# Patient Record
Sex: Female | Born: 1980 | Race: White | Hispanic: No | Marital: Married | Smoking: Never smoker
Health system: Southern US, Community
[De-identification: ages and names within clinical notes are randomized; demographics above are authoritative.]

## PROBLEM LIST (undated history)

## (undated) DIAGNOSIS — F419 Anxiety disorder, unspecified: Secondary | ICD-10-CM

---

## 2002-08-19 HISTORY — PX: WISDOM TOOTH EXTRACTION: SHX21

## 2012-10-08 ENCOUNTER — Encounter: Payer: Self-pay | Admitting: Certified Nurse Midwife

## 2012-10-08 ENCOUNTER — Ambulatory Visit: Payer: BC Managed Care – PPO | Admitting: Certified Nurse Midwife

## 2012-10-08 VITALS — BP 120/72 | Ht 64.0 in | Wt 127.0 lb

## 2012-10-08 MED ORDER — LEVONORGESTREL-ETHINYL ESTRAD 0.15-30 MG-MCG PO TABS: 1.0000 | ORAL_TABLET | Freq: Every day | ORAL | Status: DC

## 2012-10-08 MED ORDER — LEVONORGEST-ETH ESTRAD 91-DAY 0.15-0.03 MG PO TABS: 1.0000 | ORAL_TABLET | Freq: Every day | ORAL | Status: DC

## 2012-10-08 NOTE — Patient Instructions (Signed)
Brief discussion on pg. Planning Continue PNV if elects to conceive RTO prn and AEX

## 2012-10-08 NOTE — Progress Notes (Signed)
Annual Physical Exam & refill contraception No complaints voiced Considering a pg next year PE:  Thyroid wnl  Heart: RRR without murmur  Lungs: C to A  No CVAT  Abd: non- tender, no masses, soft  Pelvic:  Ext. Genitalia wnl, no lesions    Speculum exam- nl appearing d/c    Cx. Without lesions, no CMT    Uterus and adnexa with masses and non-tender    Bimanual-WNL Ass:  Nl gyn exam   Plan:  Refill OC's (Amethia)  PNV if considering a pregnancy

## 2012-10-08 NOTE — Progress Notes (Signed)
Last Pap: 10/01/2011 WNL: Yes Regular Periods:yes Contraception: Pill/Amethia  Monthly Breast exam:yes Tetanus<90yrs:yes Nl.Bladder Function:yes Daily BMs:yes Healthy Diet:yes Calcium:yes Mammogram:no Date of Mammogram: N/A Exercise:yes Have often Exercise: walk daily Seatbelt: yes Abuse at home: no Stressful work:yes Sigmoid-colonoscopy: None Bone Density: None PCP: None Change in PMH: None Change in ZOX:WRUE

## 2012-10-12 LAB — PAP IG W/ RFLX HPV ASCU

## 2013-10-11 LAB — OB RESULTS CONSOLE ABO/RH: RH Type: POSITIVE

## 2013-10-11 LAB — OB RESULTS CONSOLE RUBELLA ANTIBODY, IGM: RUBELLA: IMMUNE

## 2013-10-11 LAB — OB RESULTS CONSOLE RPR: RPR: NONREACTIVE

## 2013-10-11 LAB — OB RESULTS CONSOLE HEPATITIS B SURFACE ANTIGEN: HEP B S AG: NEGATIVE

## 2013-10-11 LAB — OB RESULTS CONSOLE ANTIBODY SCREEN: Antibody Screen: NEGATIVE

## 2013-10-11 LAB — OB RESULTS CONSOLE HIV ANTIBODY (ROUTINE TESTING): HIV: NONREACTIVE

## 2013-11-05 ENCOUNTER — Encounter (HOSPITAL_COMMUNITY): Payer: Self-pay

## 2013-11-05 ENCOUNTER — Inpatient Hospital Stay (HOSPITAL_COMMUNITY)
Admission: AD | Admit: 2013-11-05 | Discharge: 2013-11-15 | DRG: 765 | Disposition: A | Discharge status: Home / Self Care | Payer: BC Managed Care – PPO | Source: Ambulatory Visit | Attending: Obstetrics and Gynecology | Admitting: Obstetrics and Gynecology

## 2013-11-05 DIAGNOSIS — F411 Generalized anxiety disorder: Secondary | ICD-10-CM | POA: Diagnosis present

## 2013-11-05 DIAGNOSIS — D689 Coagulation defect, unspecified: Secondary | ICD-10-CM | POA: Diagnosis present

## 2013-11-05 DIAGNOSIS — O9912 Other diseases of the blood and blood-forming organs and certain disorders involving the immune mechanism complicating childbirth: Secondary | ICD-10-CM

## 2013-11-05 DIAGNOSIS — O99344 Other mental disorders complicating childbirth: Secondary | ICD-10-CM | POA: Diagnosis present

## 2013-11-05 DIAGNOSIS — D649 Anemia, unspecified: Secondary | ICD-10-CM | POA: Diagnosis not present

## 2013-11-05 DIAGNOSIS — O1414 Severe pre-eclampsia complicating childbirth: Principal | ICD-10-CM | POA: Diagnosis present

## 2013-11-05 DIAGNOSIS — Z98891 History of uterine scar from previous surgery: Secondary | ICD-10-CM

## 2013-11-05 DIAGNOSIS — O1490 Unspecified pre-eclampsia, unspecified trimester: Secondary | ICD-10-CM | POA: Diagnosis present

## 2013-11-05 DIAGNOSIS — O163 Unspecified maternal hypertension, third trimester: Secondary | ICD-10-CM | POA: Diagnosis present

## 2013-11-05 DIAGNOSIS — O9903 Anemia complicating the puerperium: Secondary | ICD-10-CM | POA: Diagnosis not present

## 2013-11-05 DIAGNOSIS — D696 Thrombocytopenia, unspecified: Secondary | ICD-10-CM | POA: Diagnosis present

## 2013-11-05 HISTORY — DX: Anxiety disorder, unspecified: F41.9

## 2013-11-05 LAB — COMPREHENSIVE METABOLIC PANEL
ALT: 56 U/L — ABNORMAL HIGH (ref 0–35)
AST: 35 U/L (ref 0–37)
Albumin: 2.6 g/dL — ABNORMAL LOW (ref 3.5–5.2)
Alkaline Phosphatase: 121 U/L — ABNORMAL HIGH (ref 39–117)
BUN: 12 mg/dL (ref 6–23)
CO2: 19 meq/L (ref 19–32)
Calcium: 9.4 mg/dL (ref 8.4–10.5)
Chloride: 102 mEq/L (ref 96–112)
Creatinine, Ser: 0.67 mg/dL (ref 0.50–1.10)
GFR calc non Af Amer: >90 mL/min (ref >90)
GLUCOSE: 69 mg/dL — AB (ref 70–99)
POTASSIUM: 4 meq/L (ref 3.7–5.3)
SODIUM: 136 meq/L — AB (ref 137–147)
Total Bilirubin: 0.2 mg/dL — ABNORMAL LOW (ref 0.3–1.2)
Total Protein: 6.3 g/dL (ref 6.0–8.3)

## 2013-11-05 LAB — CBC
HCT: 34.8 % — ABNORMAL LOW (ref 36.0–46.0)
HEMOGLOBIN: 12.1 g/dL (ref 12.0–15.0)
MCH: 31.9 pg (ref 26.0–34.0)
MCHC: 34.8 g/dL (ref 30.0–36.0)
MCV: 91.8 fL (ref 78.0–100.0)
Platelets: 134 10*3/uL — ABNORMAL LOW (ref 150–400)
RBC: 3.79 MIL/uL — AB (ref 3.87–5.11)
RDW: 14.6 % (ref 11.5–15.5)
WBC: 10.9 10*3/uL — ABNORMAL HIGH (ref 4.0–10.5)

## 2013-11-05 LAB — URIC ACID: URIC ACID, SERUM: 4.4 mg/dL (ref 2.4–7.0)

## 2013-11-05 LAB — PROTEIN / CREATININE RATIO, URINE
Creatinine, Urine: 44.42 mg/dL
Protein Creatinine Ratio: 0.58 — ABNORMAL HIGH (ref 0.00–0.15)
Total Protein, Urine: 25.8 mg/dL

## 2013-11-05 LAB — LACTATE DEHYDROGENASE: LDH: 243 U/L (ref 94–250)

## 2013-11-05 MED ORDER — PRENATAL MULTIVITAMIN CH
1.0000 | ORAL_TABLET | Freq: Every day | ORAL | Status: DC
  Administered 2013-11-06 – 2013-11-11 (×6): 1 via ORAL
  Filled 2013-11-05 (×6): qty 1

## 2013-11-05 MED ORDER — DOCUSATE SODIUM 100 MG PO CAPS
100.0000 mg | ORAL_CAPSULE | Freq: Every day | ORAL | Status: DC
  Administered 2013-11-06 – 2013-11-11 (×5): 100 mg via ORAL
  Filled 2013-11-05 (×5): qty 1

## 2013-11-05 MED ORDER — CALCIUM CARBONATE ANTACID 500 MG PO CHEW
2.0000 | CHEWABLE_TABLET | ORAL | Status: DC | PRN
  Administered 2013-11-05 – 2013-11-07 (×4): 400 mg via ORAL
  Filled 2013-11-05 (×8): qty 1

## 2013-11-05 MED ORDER — LABETALOL HCL 200 MG PO TABS
200.0000 mg | ORAL_TABLET | Freq: Three times a day (TID) | ORAL | Status: DC
Start: 2013-11-05 — End: 2013-11-06
  Administered 2013-11-05 – 2013-11-06 (×3): 200 mg via ORAL
  Filled 2013-11-05 (×6): qty 1

## 2013-11-05 MED ORDER — ZOLPIDEM TARTRATE 5 MG PO TABS
5.0000 mg | ORAL_TABLET | Freq: Every evening | ORAL | Status: DC | PRN
  Administered 2013-11-06 – 2013-11-11 (×3): 5 mg via ORAL
  Filled 2013-11-05 (×3): qty 1

## 2013-11-05 MED ORDER — BETAMETHASONE SOD PHOS & ACET 6 (3-3) MG/ML IJ SUSP
12.0000 mg | INTRAMUSCULAR | Status: AC
  Administered 2013-11-05 – 2013-11-06 (×2): 12 mg via INTRAMUSCULAR
  Filled 2013-11-05 (×2): qty 2

## 2013-11-05 MED ORDER — ACETAMINOPHEN 325 MG PO TABS
650.0000 mg | ORAL_TABLET | ORAL | Status: DC | PRN
  Administered 2013-11-05 – 2013-11-11 (×9): 650 mg via ORAL
  Filled 2013-11-05 (×9): qty 2

## 2013-11-05 NOTE — H&P (Signed)
Emily Turner is a 33 y.o. female presenting for Elevated blood pressures in the office. Denies HA, blurred vision, double vision, edema, SOB, or epigastric pain.  Reports active fetus.  No LOF, VB, or ctx.  Maternal Medical History:  Reason for admission: Elevated B/P in office.  R/O PIH.  Fetal activity: Perceived fetal activity is normal.   Last perceived fetal movement was within the past hour.    Prenatal complications: PIH.   Prenatal Complications - Diabetes: none.    OB History   Grav Para Term Preterm Abortions TAB SAB Ect Mult Living   1              Past Medical History  Diagnosis Date  . Anxiety    Past Surgical History  Procedure Laterality Date  . Wisdom tooth extraction  2004   Family History: family history includes Hyperlipidemia in her mother; Hypertension in her mother. Social History:  reports that she has never smoked. She has never used smokeless tobacco. She reports that she does not drink alcohol or use illicit drugs.   Prenatal Transfer Tool  Maternal Diabetes: No Genetic Screening: Declined Maternal Ultrasounds/Referrals: Normal Fetal Ultrasounds or other Referrals:  None Maternal Substance Abuse:  No Significant Maternal Medications:  None Significant Maternal Lab Results:  None Other Comments:  None  Review of Systems  Respiratory: Negative.   Musculoskeletal: Negative.       Blood pressure 131/94, pulse 89, temperature 98.3 F (36.8 C), temperature source Oral, resp. rate 20, height 5\' 4"  (1.626 m), weight 142 lb 6.4 oz (64.592 kg), SpO2 99.00%. Exam Physical Exam  Prenatal labs: ABO, Rh:  B+  Antibody:  Negative Rubella:  Immune RPR:   NonReactive HBsAg:   Negative  HIV:   Negative GBS:   Unknown  Assessment: IUP at 32wks Elevated BP Rule Out Preeclampsia  Plan: Admit to Antenatal for Observation per Dr. Dorris CarnesN. Dillard Labs: CMP, UA, LDH, CBC, and PC Ratio--Pending Labetalol 200mg  TID PO Betamethasone now, repeat in 24  hours  1600 Consult with Dr. Audree CamelN. Dillard  Patient status discussed-anxiety; wants to know info regarding possible c/s and NICU admission for fetus Labs Reviewed Repeat labs at 0500 NICU Consult ordered Await PC Ratio-report to Dr. Audree CamelN. Dillard once available    [facility]oursvilleEMLY, Emily Turner CNM, MSN 11/05/2013, 4:29 PM

## 2013-11-05 NOTE — Progress Notes (Signed)
Chaplain received referral from patient's RN, who said that patient is a new admission with high anxiety. Chaplain met with patient and patient's husband. Patient appeared anxious, but said she is "much better since receiving blood pressure medication." RN took her blood pressure during visit and patient was relieved that it was much lower. She said "she is grateful that she had an appointment today because otherwise she wouldn't have known that she was sick." She "hopes to be able to go home." She was just preparing for a nap, so chaplain explained spiritual care services and availability. Provided compassionate and empathic listening, and emotional and spiritual support. Will follow up as able, but please page if needed.  ° °Hillary D Irusta, Chaplain [phone] °

## 2013-11-05 NOTE — Progress Notes (Signed)
S: Patient calm with spouse at bedside. Reports HA of 5/10 O:  Filed Vitals:   11/05/13 1423 11/05/13 1533 11/05/13 1602 11/05/13 1724  BP: 154/108 156/116 131/94   Pulse: 86 99 89   Temp:   98.3 F (36.8 C)   TempSrc:      Resp:   20 20  Height:      Weight:      SpO2:       Results for orders placed during the hospital encounter of 11/05/13 (from the past 24 hour(s))  PROTEIN / CREATININE RATIO, URINE     Status: Abnormal   Collection Time    11/05/13 12:30 PM      Result Value Ref Range   Creatinine, Urine 44.42     Total Protein, Urine 25.8     PROTEIN CREATININE RATIO 0.58 (*) 0.00 - 0.15  COMPREHENSIVE METABOLIC PANEL     Status: Abnormal   Collection Time    11/05/13  2:10 PM      Result Value Ref Range   Sodium 136 (*) 137 - 147 mEq/L   Potassium 4.0  3.7 - 5.3 mEq/L   Chloride 102  96 - 112 mEq/L   CO2 19  19 - 32 mEq/L   Glucose, Bld 69 (*) 70 - 99 mg/dL   BUN 12  6 - 23 mg/dL   Creatinine, Ser 8.11  0.50 - 1.10 mg/dL   Calcium 9.4  8.4 - 91.4 mg/dL   Total Protein 6.3  6.0 - 8.3 g/dL   Albumin 2.6 (*) 3.5 - 5.2 g/dL   AST 35  0 - 37 U/L   ALT 56 (*) 0 - 35 U/L   Alkaline Phosphatase 121 (*) 39 - 117 U/L   Total Bilirubin 0.2 (*) 0.3 - 1.2 mg/dL   GFR calc non Af Amer >90  >90 mL/min   GFR calc Af Amer >90  >90 mL/min  URIC ACID     Status: None   Collection Time    11/05/13  2:10 PM      Result Value Ref Range   Uric Acid, Serum 4.4  2.4 - 7.0 mg/dL  LACTATE DEHYDROGENASE     Status: None   Collection Time    11/05/13  2:10 PM      Result Value Ref Range   LDH 243  94 - 250 U/L  CBC     Status: Abnormal   Collection Time    11/05/13  2:10 PM      Result Value Ref Range   WBC 10.9 (*) 4.0 - 10.5 K/uL   RBC 3.79 (*) 3.87 - 5.11 MIL/uL   Hemoglobin 12.1  12.0 - 15.0 g/dL   HCT 78.2 (*) 95.6 - 21.3 %   MCV 91.8  78.0 - 100.0 fL   MCH 31.9  26.0 - 34.0 pg   MCHC 34.8  30.0 - 36.0 g/dL   RDW 08.6  57.8 - 46.9 %   Platelets 134 (*) 150 - 400 K/uL    A: IUP at 32wks Preeclampsia  P: PC Ratio results obtained.  Discussed and consulted with Dr. Audree Camel.  To room to discuss PC ratio results and Preeclampsia diagnosis with patient.  Questions and concerns addressed.  Explained that will attempt to await until bethamethasone dosing is complete x 24 hours until delivery.  However, POC will be established based on MFM recommendations.  Patient verbalized understanding.  Requests to stay updated regarding POC and infant status. MFM  consult ordered. Tylenol for HA Continue to monitor Reassess prn.  Satoshi Kalas LYNN 6:33 PM

## 2013-11-05 NOTE — MAU Note (Signed)
Pt here from MD office [facility] Denies h/a, dizziness, blurred vision. No bleeding or vag d/c issues.

## 2013-11-06 ENCOUNTER — Observation Stay (HOSPITAL_COMMUNITY): Payer: BC Managed Care – PPO

## 2013-11-06 LAB — COMPREHENSIVE METABOLIC PANEL
ALBUMIN: 2.3 g/dL — AB (ref 3.5–5.2)
ALT: 45 U/L — ABNORMAL HIGH (ref 0–35)
AST: 24 U/L (ref 0–37)
Alkaline Phosphatase: 112 U/L (ref 39–117)
BUN: 11 mg/dL (ref 6–23)
CALCIUM: 9.1 mg/dL (ref 8.4–10.5)
CHLORIDE: 104 meq/L (ref 96–112)
CO2: 19 mEq/L (ref 19–32)
CREATININE: 0.64 mg/dL (ref 0.50–1.10)
GFR calc Af Amer: >90 mL/min (ref >90)
GFR calc non Af Amer: >90 mL/min (ref >90)
Glucose, Bld: 128 mg/dL — ABNORMAL HIGH (ref 70–99)
Potassium: 4.3 mEq/L (ref 3.7–5.3)
Sodium: 136 mEq/L — ABNORMAL LOW (ref 137–147)
TOTAL PROTEIN: 5.9 g/dL — AB (ref 6.0–8.3)

## 2013-11-06 LAB — CBC
HCT: 30.9 % — ABNORMAL LOW (ref 36.0–46.0)
Hemoglobin: 10.6 g/dL — ABNORMAL LOW (ref 12.0–15.0)
MCH: 31.5 pg (ref 26.0–34.0)
MCHC: 34.3 g/dL (ref 30.0–36.0)
MCV: 91.7 fL (ref 78.0–100.0)
Platelets: 125 10*3/uL — ABNORMAL LOW (ref 150–400)
RBC: 3.37 MIL/uL — ABNORMAL LOW (ref 3.87–5.11)
RDW: 14.6 % (ref 11.5–15.5)
WBC: 15.5 10*3/uL — ABNORMAL HIGH (ref 4.0–10.5)

## 2013-11-06 LAB — URIC ACID: Uric Acid, Serum: 4.1 mg/dL (ref 2.4–7.0)

## 2013-11-06 LAB — LACTATE DEHYDROGENASE: LDH: 203 U/L (ref 94–250)

## 2013-11-06 MED ORDER — LABETALOL HCL 100 MG PO TABS
100.0000 mg | ORAL_TABLET | Freq: Three times a day (TID) | ORAL | Status: DC
  Administered 2013-11-06 – 2013-11-09 (×10): 100 mg via ORAL
  Filled 2013-11-06 (×13): qty 1

## 2013-11-06 NOTE — Progress Notes (Signed)
Late entry.  Called away to delivery.  Pt evaluated at ~0840.   HD #2  IUP @ 32 3/7 weeks Severe Preeclampsia, Asymmetric growth.  Pt without complaints.  Denies headaches, visual changes, abdominal pain.  Fetus is active. Concerns about appearance of baby and its "big head" upon delivery. Less anxious about proceeding with delivery preterm.  Awaiting NICU consult.  No contractions, LOF, VB.     Filed Vitals:   11/05/13 2304 11/06/13 0645 11/06/13 0745 11/06/13 0746  BP: 129/76 125/70  125/79  Pulse: 81 88  81  Temp: 98.3 F (36.8 C) 98.3 F (36.8 C) 98.6 F (37 C)   TempSrc: Oral Oral Oral   Resp: 18 20    Height:      Weight:      SpO2:       Gen:  NAD, upright in bed seated in [place] style CV:  RRR Lungs:  CTA bilaterally Abdomen:  No RUQ pain, gravid uterus, NT Ext:  Trace edema, no calf tenderness Neuro: 2+ DTR.  Cat 1 tracing.  Good variability with accels.  No decelerations.  No contractions.    Results for orders placed during the hospital encounter of 11/05/13 (from the past 24 hour(s))  PROTEIN / CREATININE RATIO, URINE     Status: Abnormal   Collection Time    11/05/13 12:30 PM      Result Value Ref Range   Creatinine, Urine 44.42     Total Protein, Urine 25.8     PROTEIN CREATININE RATIO 0.58 (*) 0.00 - 0.15  COMPREHENSIVE METABOLIC PANEL     Status: Abnormal   Collection Time    11/05/13  2:10 PM      Result Value Ref Range   Sodium 136 (*) 137 - 147 mEq/L   Potassium 4.0  3.7 - 5.3 mEq/L   Chloride 102  96 - 112 mEq/L   CO2 19  19 - 32 mEq/L   Glucose, Bld 69 (*) 70 - 99 mg/dL   BUN 12  6 - 23 mg/dL   Creatinine, Ser 1.61  0.50 - 1.10 mg/dL   Calcium 9.4  8.4 - 09.6 mg/dL   Total Protein 6.3  6.0 - 8.3 g/dL   Albumin 2.6 (*) 3.5 - 5.2 g/dL   AST 35  0 - 37 U/L   ALT 56 (*) 0 - 35 U/L   Alkaline Phosphatase 121 (*) 39 - 117 U/L   Total Bilirubin 0.2 (*) 0.3 - 1.2 mg/dL   GFR calc non Af Amer >90  >90 mL/min   GFR calc Af Amer >90  >90  mL/min  URIC ACID     Status: None   Collection Time    11/05/13  2:10 PM      Result Value Ref Range   Uric Acid, Serum 4.4  2.4 - 7.0 mg/dL  LACTATE DEHYDROGENASE     Status: None   Collection Time    11/05/13  2:10 PM      Result Value Ref Range   LDH 243  94 - 250 U/L  CBC     Status: Abnormal   Collection Time    11/05/13  2:10 PM      Result Value Ref Range   WBC 10.9 (*) 4.0 - 10.5 K/uL   RBC 3.79 (*) 3.87 - 5.11 MIL/uL   Hemoglobin 12.1  12.0 - 15.0 g/dL   HCT 04.5 (*) 40.9 - 81.1 %   MCV 91.8  78.0 - 100.0  fL   MCH 31.9  26.0 - 34.0 pg   MCHC 34.8  30.0 - 36.0 g/dL   RDW 32.314.6  55.711.5 - 32.215.5 %   Platelets 134 (*) 150 - 400 K/uL  CBC     Status: Abnormal   Collection Time    11/06/13  5:40 AM      Result Value Ref Range   WBC 15.5 (*) 4.0 - 10.5 K/uL   RBC 3.37 (*) 3.87 - 5.11 MIL/uL   Hemoglobin 10.6 (*) 12.0 - 15.0 g/dL   HCT 02.530.9 (*) 42.736.0 - 06.246.0 %   MCV 91.7  78.0 - 100.0 fL   MCH 31.5  26.0 - 34.0 pg   MCHC 34.3  30.0 - 36.0 g/dL   RDW 37.614.6  28.311.5 - 15.115.5 %   Platelets 125 (*) 150 - 400 K/uL  COMPREHENSIVE METABOLIC PANEL     Status: Abnormal   Collection Time    11/06/13  5:40 AM      Result Value Ref Range   Sodium 136 (*) 137 - 147 mEq/L   Potassium 4.3  3.7 - 5.3 mEq/L   Chloride 104  96 - 112 mEq/L   CO2 19  19 - 32 mEq/L   Glucose, Bld 128 (*) 70 - 99 mg/dL   BUN 11  6 - 23 mg/dL   Creatinine, Ser 7.610.64  0.50 - 1.10 mg/dL   Calcium 9.1  8.4 - 60.710.5 mg/dL   Total Protein 5.9 (*) 6.0 - 8.3 g/dL   Albumin 2.3 (*) 3.5 - 5.2 g/dL   AST 24  0 - 37 U/L   ALT 45 (*) 0 - 35 U/L   Alkaline Phosphatase 112  39 - 117 U/L   Total Bilirubin <0.2 (*) 0.3 - 1.2 mg/dL   GFR calc non Af Amer >90  >90 mL/min   GFR calc Af Amer >90  >90 mL/min  LACTATE DEHYDROGENASE     Status: None   Collection Time    11/06/13  5:40 AM      Result Value Ref Range   LDH 203  94 - 250 U/L  URIC ACID     Status: None   Collection Time    11/06/13  5:40 AM      Result Value Ref  Range   Uric Acid, Serum 4.1  2.4 - 7.0 mg/dL   A:  IUP at 32 3/7 wks Severe Preeclampsia by BP. Asymmetric growth, Elevated umbilical artery doppler.   -BP normal to mildly elevated with Labetalol 200 mg TID and bedrest.   -Platelets decreased but not less than 100.  ALT trending down. S/p BMZ #1.  Next dose due at 1500.  P:  Per Dr. Joya Gaskinsilliard, plan of care to be determined by MFM.  Awaiting consult at this time. Continue inpatient bedrest and Labetalol at current dose. Weight today. Continuous fetal monitoring. Labs in am to reassess platelets. Awaiting NICU consult. Discussed diagnosis and possible POC with pt and husband at length.  Anticipate IOL 24 hours after last dose of BMZ. All questions answered.  RN present.  Emily Turner 12:13 PM

## 2013-11-06 NOTE — Consult Note (Signed)
MFM Staff Consult Note  Impressions: Remigio EisenmengerSheena Turner is a 33 y.o. G1 at 7232 3/7wks  Persistent severe range blood pressures with proteinuria by spot P:C ratio 0.5  --->  Constitutes Dx:  Preeclampsia with severe features (by BP criteria)  Summary of Recommendations: 1. Agree with initiation of course of corticosteroids,  2. Treat severe range blood pressures with antihypertensive IV (eg, labetalol) as needed 3. Continue labetalol as initiated but given trend in BP she is supratherapeutic so I would decrease from 200mg  po TID to 100mg  po TID 3. CEFM while getting steroids then move toward BID NST for fetal well-being 4. Would recommend starting a 24 hour urine collection for protein 5. Would assess this patient clinically frequently to detect evidence of either maternal or fetal deterioration, noting that expectant management would not be recommended if deterioration of either mom or fetus became apparent 6. Would assess LFTs/Cr/CBC  24 hours at a minimum given patient's presenting BP's to facilitate early detection of maternal end organ failure.  If Cr is at or above 1.2, delivery would be recommended.  If platelet count becomes less than 100,000, delivery would be recommended.  If patient becomes hypoglycemic and/or transaminitis is clearly demonstrated (eg,2-3-fold upper limit of normal values), I would recommend discontinuing expectant management.  If maternal HTN is not amenable to control then I would recommend delivery. 7. If eclampsia is encountered, would recommend treatment of eclamptic seizure to stabilize the maternal-fetal unit.  Then, once stabilization has been established, discontinue expectant management and deliver this patient. 8. Recommend targeted ultrasound by our practice 9. Lastly, regardless of whether this mom and baby continue to remain stable, I would recommend delivery by 34 weeks with Magnesium sulfate during induction/intrapartum/postpartum, noting this remains contingent  upon a reassuring fetal heart tracing and stable maternal condition.  If maternal/fetal deterioration criteria above are demonstrated, start magnesium sulfate and move toward delivery.  I spent in excess of 45 minutes reviewing and evaluating this patient's case with greater than 50% of this time in face to face discussion.  Page with questions.  Merideth AbbeyJ. M. Jaylina Ramdass, MD, MS, FACOG Assistant Professor, Maternal-Fetal Medicine

## 2013-11-07 LAB — COMPREHENSIVE METABOLIC PANEL
ALBUMIN: 2.4 g/dL — AB (ref 3.5–5.2)
ALK PHOS: 110 U/L (ref 39–117)
ALT: 35 U/L (ref 0–35)
AST: 17 U/L (ref 0–37)
BUN: 12 mg/dL (ref 6–23)
CHLORIDE: 104 meq/L (ref 96–112)
CO2: 19 mEq/L (ref 19–32)
Calcium: 9 mg/dL (ref 8.4–10.5)
Creatinine, Ser: 0.7 mg/dL (ref 0.50–1.10)
GFR calc Af Amer: >90 mL/min (ref >90)
GFR calc non Af Amer: >90 mL/min (ref >90)
Glucose, Bld: 123 mg/dL — ABNORMAL HIGH (ref 70–99)
POTASSIUM: 4.2 meq/L (ref 3.7–5.3)
SODIUM: 138 meq/L (ref 137–147)
Total Protein: 5.9 g/dL — ABNORMAL LOW (ref 6.0–8.3)

## 2013-11-07 LAB — CBC
HCT: 31.4 % — ABNORMAL LOW (ref 36.0–46.0)
Hemoglobin: 10.8 g/dL — ABNORMAL LOW (ref 12.0–15.0)
MCH: 32 pg (ref 26.0–34.0)
MCHC: 34.4 g/dL (ref 30.0–36.0)
MCV: 93.2 fL (ref 78.0–100.0)
PLATELETS: 157 10*3/uL (ref 150–400)
RBC: 3.37 MIL/uL — ABNORMAL LOW (ref 3.87–5.11)
RDW: 15 % (ref 11.5–15.5)
WBC: 18.4 10*3/uL — AB (ref 4.0–10.5)

## 2013-11-07 LAB — URINALYSIS, ROUTINE W REFLEX MICROSCOPIC
Bilirubin Urine: NEGATIVE
GLUCOSE, UA: NEGATIVE mg/dL
Hgb urine dipstick: NEGATIVE
KETONES UR: NEGATIVE mg/dL
Leukocytes, UA: NEGATIVE
Nitrite: NEGATIVE
PROTEIN: NEGATIVE mg/dL
Specific Gravity, Urine: 1.01 (ref 1.005–1.030)
Urobilinogen, UA: 0.2 mg/dL (ref 0.0–1.0)
pH: 6 (ref 5.0–8.0)

## 2013-11-07 LAB — PROTEIN, URINE, 24 HOUR
Collection Interval-UPROT: 24 hours
PROTEIN 24H UR: 299 mg/d — AB (ref 50–100)
Protein, Urine: 26 mg/dL
URINE TOTAL VOLUME-UPROT: 1150 mL

## 2013-11-07 NOTE — Progress Notes (Signed)
Late entry.  Called away to delivery.  Pt evaluated at ~0840.   HD #3  IUP @ 32 4/7 weeks Severe Preeclampsia, Asymmetric growth.  Pt without complaints.  Denies headaches, visual changes, abdominal pain.  Fetus is active. C/o malodorous urine, which pt feels is due to Labetalol.  Comfortable with SCD hose.   Awaiting NICU consult.   S/p MFM consult.  Feels comfortable with plan.  Questions answered adequately by Dr. Marjo Bicker.  No contractions, LOF, VB.     Filed Vitals:   11/07/13 0534 11/07/13 0758 11/07/13 0930 11/07/13 1210  BP: 127/84 131/92  136/92  Pulse: 78 77  80  Temp: 98.2 F (36.8 C) 98.2 F (36.8 C)  98.5 F (36.9 C)  TempSrc: Oral Oral  Oral  Resp: 16 18  18   Height:      Weight:   63.957 kg (141 lb)   SpO2:       Gen:  NAD, upright in bed seated in [place] style CV:  RRR Lungs:  CTA bilaterally Abdomen:  No RUQ pain, gravid uterus, NT Ext:  Trace edema, no calf tenderness Neuro: 2+ DTR.  Cat 1 tracing.  Good variability with accels.  Variable decel x 1 to 90s.(1048), spontaneous decel to 100s less than 1 minute (0848)    Results for orders placed during the hospital encounter of 11/05/13 (from the past 24 hour(s))  CBC     Status: Abnormal   Collection Time    11/07/13  5:28 AM      Result Value Ref Range   WBC 18.4 (*) 4.0 - 10.5 K/uL   RBC 3.37 (*) 3.87 - 5.11 MIL/uL   Hemoglobin 10.8 (*) 12.0 - 15.0 g/dL   HCT 96.0 (*) 45.4 - 09.8 %   MCV 93.2  78.0 - 100.0 fL   MCH 32.0  26.0 - 34.0 pg   MCHC 34.4  30.0 - 36.0 g/dL   RDW 11.9  14.7 - 82.9 %   Platelets 157  150 - 400 K/uL  COMPREHENSIVE METABOLIC PANEL     Status: Abnormal   Collection Time    11/07/13  5:28 AM      Result Value Ref Range   Sodium 138  137 - 147 mEq/L   Potassium 4.2  3.7 - 5.3 mEq/L   Chloride 104  96 - 112 mEq/L   CO2 19  19 - 32 mEq/L   Glucose, Bld 123 (*) 70 - 99 mg/dL   BUN 12  6 - 23 mg/dL   Creatinine, Ser 5.62  0.50 - 1.10 mg/dL   Calcium 9.0  8.4 - 13.0 mg/dL    Total Protein 5.9 (*) 6.0 - 8.3 g/dL   Albumin 2.4 (*) 3.5 - 5.2 g/dL   AST 17  0 - 37 U/L   ALT 35  0 - 35 U/L   Alkaline Phosphatase 110  39 - 117 U/L   Total Bilirubin <0.2 (*) 0.3 - 1.2 mg/dL   GFR calc non Af Amer >90  >90 mL/min   GFR calc Af Amer >90  >90 mL/min   A:  IUP at 32 4/7 wks Severe Preeclampsia by BP. Asymmetric growth, Elevated umbilical artery doppler.   -BP normal with Labetalol 100 mg TID and bedrest.  Decreased from 200 mg TID per MFM recommendations.   Consider decreasing to 100 mg BID for a goal of 140s/80-90s.   -Platelets decreased 126 to 123 but not less than 100.  ALT trending down. S/p  BMZ #2.  Mature at 1500 today. Malodorous urine.  P:  Spoke with Dr. Marjo Bickerenney yesterday and reviewed his note.  Recommendations greatly appreciated  F/u results from last nights ultrasound.  24 hour urine protein in progress.  Continue inpatient bedrest and Labetalol at current dose.  NST BID  Daily labs. Check UA and culture 9pm after 24 hour urine collected. Awaiting NICU consult. Delivery if worsening maternal or fetal status.  Emily Turner 12:34 PM

## 2013-11-07 NOTE — Consult Note (Signed)
[facility]  [facility] Consultation       11/07/2013    8:59 PM  I was called at the request of the patient's obstetrician (Dr. Dion BodyVarnado) to speak to this patient due to potential preterm delivery at > 32 weeks.  The patient's prenatal course includes elevated blood pressure.  She is 32 4/7 weeks currently.  She is admitted to antenatal unit, and is receiving treatment that includes labetalol.  The baby is a female.  I reviewed expectations for a baby born at 32-35 weeks, including survival, length of stay, morbidities such as respiratory distress, IVH, infection, feeding intolerance, retinopathy.  I described how we provide respiratory and feeding support.  Mom plans to breast feed, which I encouraged as best for the baby (with supplementations for needed calories).  I let mom know that the baby's outlook generally improves the longer she remains undelivered.  I spent 20 minutes reviewing the record, speaking to the patient, and entering appropriate documentation.  More than 50% of the time was spent face to face with patient.   _____________________ Electronically Signed By: Angelita InglesMcCrae S. Khrystal Jeanmarie, MD Neonatologist

## 2013-11-08 LAB — COMPREHENSIVE METABOLIC PANEL
ALBUMIN: 2.4 g/dL — AB (ref 3.5–5.2)
ALT: 27 U/L (ref 0–35)
AST: 19 U/L (ref 0–37)
Alkaline Phosphatase: 102 U/L (ref 39–117)
BUN: 13 mg/dL (ref 6–23)
CALCIUM: 9 mg/dL (ref 8.4–10.5)
CHLORIDE: 104 meq/L (ref 96–112)
CO2: 21 mEq/L (ref 19–32)
Creatinine, Ser: 0.72 mg/dL (ref 0.50–1.10)
GFR calc Af Amer: >90 mL/min (ref >90)
GFR calc non Af Amer: >90 mL/min (ref >90)
Glucose, Bld: 108 mg/dL — ABNORMAL HIGH (ref 70–99)
Potassium: 4.2 mEq/L (ref 3.7–5.3)
Sodium: 138 mEq/L (ref 137–147)
Total Bilirubin: <0.2 mg/dL — ABNORMAL LOW (ref 0.3–1.2)
Total Protein: 6 g/dL (ref 6.0–8.3)

## 2013-11-08 LAB — CBC
HCT: 32.3 % — ABNORMAL LOW (ref 36.0–46.0)
Hemoglobin: 10.8 g/dL — ABNORMAL LOW (ref 12.0–15.0)
MCH: 31.4 pg (ref 26.0–34.0)
MCHC: 33.4 g/dL (ref 30.0–36.0)
MCV: 93.9 fL (ref 78.0–100.0)
PLATELETS: 175 10*3/uL (ref 150–400)
RBC: 3.44 MIL/uL — AB (ref 3.87–5.11)
RDW: 15.3 % (ref 11.5–15.5)
WBC: 15.1 10*3/uL — ABNORMAL HIGH (ref 4.0–10.5)

## 2013-11-08 LAB — URINE CULTURE
Colony Count: NO GROWTH
Culture: NO GROWTH

## 2013-11-08 NOTE — Progress Notes (Signed)
Hospital day # 3 pregnancy at [redacted]w[redacted]d--Pre-eclampsia.  S:  Sleeping at present.    O: BP 150/81  Pulse 61  Temp(Src) 98 F (36.7 C) (Oral)  Resp 20  Ht 5\' 4"  (1.626 m)  Wt 141 lb (63.957 kg)  BMI 24.19 kg/m2  SpO2 99%  Filed Vitals:   11/07/13 2018 11/07/13 2100 11/07/13 2154 11/08/13 0603  BP: 134/79  139/79 150/81  Pulse: 58  65 61  Temp:    98 F (36.7 C)  TempSrc:    Oral  Resp: 16 18 18 20   Height:      Weight:      SpO2:             Fetal tracings:  Category 1 on late evening NST      Contractions:   Very occasional              Labs:   Results for orders placed during the hospital encounter of 11/05/13 (from the past 24 hour(s))  URINALYSIS, ROUTINE W REFLEX MICROSCOPIC     Status: None   Collection Time    11/07/13 10:00 PM      Result Value Ref Range   Color, Urine YELLOW  YELLOW   APPearance CLEAR  CLEAR   Specific Gravity, Urine 1.010  1.005 - 1.030   pH 6.0  5.0 - 8.0   Glucose, UA NEGATIVE  NEGATIVE mg/dL   Hgb urine dipstick NEGATIVE  NEGATIVE   Bilirubin Urine NEGATIVE  NEGATIVE   Ketones, ur NEGATIVE  NEGATIVE mg/dL   Protein, ur NEGATIVE  NEGATIVE mg/dL   Urobilinogen, UA 0.2  0.0 - 1.0 mg/dL   Nitrite NEGATIVE  NEGATIVE   Leukocytes, UA NEGATIVE  NEGATIVE  CBC     Status: Abnormal   Collection Time    11/08/13  5:30 AM      Result Value Ref Range   WBC 15.1 (*) 4.0 - 10.5 K/uL   RBC 3.44 (*) 3.87 - 5.11 MIL/uL   Hemoglobin 10.8 (*) 12.0 - 15.0 g/dL   HCT 16.1 (*) 09.6 - 04.5 %   MCV 93.9  78.0 - 100.0 fL   MCH 31.4  26.0 - 34.0 pg   MCHC 33.4  30.0 - 36.0 g/dL   RDW 40.9  81.1 - 91.4 %   Platelets 175  150 - 400 K/uL  COMPREHENSIVE METABOLIC PANEL     Status: Abnormal   Collection Time    11/08/13  5:30 AM      Result Value Ref Range   Sodium 138  137 - 147 mEq/L   Potassium 4.2  3.7 - 5.3 mEq/L   Chloride 104  96 - 112 mEq/L   CO2 21  19 - 32 mEq/L   Glucose, Bld 108 (*) 70 - 99 mg/dL   BUN 13  6 - 23 mg/dL   Creatinine, Ser 7.82   0.50 - 1.10 mg/dL   Calcium 9.0  8.4 - 95.6 mg/dL   Total Protein 6.0  6.0 - 8.3 g/dL   Albumin 2.4 (*) 3.5 - 5.2 g/dL   AST 19  0 - 37 U/L   ALT 27  0 - 35 U/L   Alkaline Phosphatase 102  39 - 117 U/L   Total Bilirubin <0.2 (*) 0.3 - 1.2 mg/dL   GFR calc non Af Amer >90  >90 mL/min   GFR calc Af Amer >90  >90 mL/min   24 hour urine protein = 299.  Meds: Labetalol 100 mg po TID                 Completed betamethasone course 11/06/13  A: 6430w5d with pre-eclampsia     Stable at present  P: Continue current plan      Daily CBC, CMP      BID NST at present      MDs will follow       Nigel BridgemanLATHAM, Derk Doubek CNM, [place] 11/08/2013 6:51 AM

## 2013-11-09 LAB — COMPREHENSIVE METABOLIC PANEL
ALT: 32 U/L (ref 0–35)
AST: 27 U/L (ref 0–37)
Albumin: 2.4 g/dL — ABNORMAL LOW (ref 3.5–5.2)
Alkaline Phosphatase: 100 U/L (ref 39–117)
BUN: 14 mg/dL (ref 6–23)
CO2: 21 meq/L (ref 19–32)
CREATININE: 0.63 mg/dL (ref 0.50–1.10)
Calcium: 8.4 mg/dL (ref 8.4–10.5)
Chloride: 102 mEq/L (ref 96–112)
GFR calc Af Amer: >90 mL/min (ref >90)
GLUCOSE: 77 mg/dL (ref 70–99)
Potassium: 4.3 mEq/L (ref 3.7–5.3)
SODIUM: 135 meq/L — AB (ref 137–147)
TOTAL PROTEIN: 6 g/dL (ref 6.0–8.3)
Total Bilirubin: <0.2 mg/dL — ABNORMAL LOW (ref 0.3–1.2)

## 2013-11-09 LAB — CBC
HCT: 33 % — ABNORMAL LOW (ref 36.0–46.0)
HEMOGLOBIN: 11 g/dL — AB (ref 12.0–15.0)
MCH: 31.3 pg (ref 26.0–34.0)
MCHC: 33.3 g/dL (ref 30.0–36.0)
MCV: 94 fL (ref 78.0–100.0)
Platelets: 190 10*3/uL (ref 150–400)
RBC: 3.51 MIL/uL — AB (ref 3.87–5.11)
RDW: 15.3 % (ref 11.5–15.5)
WBC: 14.4 10*3/uL — ABNORMAL HIGH (ref 4.0–10.5)

## 2013-11-09 MED ORDER — LABETALOL HCL 100 MG PO TABS: 100.0000 mg | ORAL_TABLET | Freq: Every day | ORAL | Status: DC

## 2013-11-09 MED ORDER — LABETALOL HCL 200 MG PO TABS
200.0000 mg | ORAL_TABLET | Freq: Two times a day (BID) | ORAL | Status: DC
  Administered 2013-11-09 – 2013-11-10 (×2): 200 mg via ORAL
  Filled 2013-11-09 (×2): qty 2

## 2013-11-09 MED ORDER — LABETALOL HCL 100 MG PO TABS
100.0000 mg | ORAL_TABLET | Freq: Once | ORAL | Status: AC
  Administered 2013-11-09: 100 mg via ORAL

## 2013-11-09 NOTE — Progress Notes (Signed)
33 y.o. year old female,at 235w6d gestation.  SUBJECTIVE:  The patient reports a slight headache yesterday. This is better today. She reports having one right upper quadrant pain today. This is now resolved.  OBJECTIVE:  BP 159/103  Pulse 68  Temp(Src) 98.2 F (36.8 C) (Oral)  Resp 18  Ht 5\' 4"  (1.626 m)  Wt 140 lb 3.2 oz (63.594 kg)  BMI 24.05 kg/m2  SpO2 99%  Fetal Heart Tones:  Category 1  Contractions:          None  Chest: Clear Heart: Regular rate and rhythm Abdomen: Gravid and nontender Reflexes: Normal. No clonus. Edema: Trace Extremities: No evidence of DVT  Results for orders placed during the hospital encounter of 11/05/13 (from the past 24 hour(s))  CBC     Status: Abnormal   Collection Time    11/09/13  7:00 AM      Result Value Ref Range   WBC 14.4 (*) 4.0 - 10.5 K/uL   RBC 3.51 (*) 3.87 - 5.11 MIL/uL   Hemoglobin 11.0 (*) 12.0 - 15.0 g/dL   HCT 16.133.0 (*) 09.636.0 - 04.546.0 %   MCV 94.0  78.0 - 100.0 fL   MCH 31.3  26.0 - 34.0 pg   MCHC 33.3  30.0 - 36.0 g/dL   RDW 40.915.3  81.111.5 - 91.415.5 %   Platelets 190  150 - 400 K/uL  COMPREHENSIVE METABOLIC PANEL     Status: Abnormal   Collection Time    11/09/13  7:00 AM      Result Value Ref Range   Sodium 135 (*) 137 - 147 mEq/L   Potassium 4.3  3.7 - 5.3 mEq/L   Chloride 102  96 - 112 mEq/L   CO2 21  19 - 32 mEq/L   Glucose, Bld 77  70 - 99 mg/dL   BUN 14  6 - 23 mg/dL   Creatinine, Ser 7.820.63  0.50 - 1.10 mg/dL   Calcium 8.4  8.4 - 95.610.5 mg/dL   Total Protein 6.0  6.0 - 8.3 g/dL   Albumin 2.4 (*) 3.5 - 5.2 g/dL   AST 27  0 - 37 U/L   ALT 32  0 - 35 U/L   Alkaline Phosphatase 100  39 - 117 U/L   Total Bilirubin <0.2 (*) 0.3 - 1.2 mg/dL   GFR calc non Af Amer >90  >90 mL/min   GFR calc Af Amer >90  >90 mL/min    ASSESSMENT:  5335w6d Weeks Pregnancy  Preeclampsia- stable  Slight increase in blood pressures today. Labetalol was decreased to 100 mg 3 times each day yesterday.  PLAN:  Continue observation in  the hospital.  Monitor laboratories daily.  Nonstress test twice each day.  Increase labetalol to 200 mg in the morning and at night and 100 mg mid day.  Leonard SchwartzArthur Vernon Jaquel Coomer, M.D.

## 2013-11-09 NOTE — Progress Notes (Signed)
Chaplain followed up with patient and patient's mother. Patient said she will be here for another 8 days and then will be delivering. She and her mother both said they are "fine" and have no current needs at this time. They are familiar with chaplain services and availability.   Maurene CapesHillary D Irusta, Chaplain

## 2013-11-09 NOTE — Progress Notes (Signed)
Hospital day # 4 pregnancy at 8523w6d--Pre-eclampsia  S:  Sleeping soundly.  RN reports no issues overnight.  O: BP 143/88  Pulse 56  Temp(Src) 98.2 F (36.8 C) (Oral)  Resp 18  Ht 5\' 4"  (1.626 m)  Wt 139 lb 8 oz (63.277 kg)  BMI 23.93 kg/m2  SpO2 99%      Fetal tracings:  Category 1 on BID NST      Contractions:   None               Labs:  CBC, CMP pending this am       Meds: Labetalol 100 mg po TID  A: 4923w6d with pre-eclampsia     Betamethasone course completed 11/06/13     Stable  P: Continue current plan of care      Upcoming tests/treatments:  Induction planned for evening of 11/17/13, per            recommendation of MFM for induction at 34 weeks (patient wants to avoid          delivery on 11/17/13, if possible.      Continue NST BID      MDs will follow  Nigel BridgemanLATHAM, Kilo Eshelman CNM, [place] 11/09/2013 7:10 AM

## 2013-11-10 LAB — CBC
HEMATOCRIT: 34.2 % — AB (ref 36.0–46.0)
HEMOGLOBIN: 11.3 g/dL — AB (ref 12.0–15.0)
MCH: 31.1 pg (ref 26.0–34.0)
MCHC: 33 g/dL (ref 30.0–36.0)
MCV: 94.2 fL (ref 78.0–100.0)
Platelets: 159 10*3/uL (ref 150–400)
RBC: 3.63 MIL/uL — AB (ref 3.87–5.11)
RDW: 14.9 % (ref 11.5–15.5)
WBC: 13.7 10*3/uL — AB (ref 4.0–10.5)

## 2013-11-10 LAB — COMPREHENSIVE METABOLIC PANEL
ALBUMIN: 2.4 g/dL — AB (ref 3.5–5.2)
ALT: 75 U/L — ABNORMAL HIGH (ref 0–35)
AST: 63 U/L — AB (ref 0–37)
Alkaline Phosphatase: 106 U/L (ref 39–117)
BUN: 17 mg/dL (ref 6–23)
CALCIUM: 8.1 mg/dL — AB (ref 8.4–10.5)
CHLORIDE: 105 meq/L (ref 96–112)
CO2: 20 mEq/L (ref 19–32)
CREATININE: 0.66 mg/dL (ref 0.50–1.10)
GFR calc Af Amer: >90 mL/min (ref >90)
GLUCOSE: 83 mg/dL (ref 70–99)
Potassium: 4.4 mEq/L (ref 3.7–5.3)
Sodium: 137 mEq/L (ref 137–147)
Total Bilirubin: <0.2 mg/dL — ABNORMAL LOW (ref 0.3–1.2)
Total Protein: 6.1 g/dL (ref 6.0–8.3)

## 2013-11-10 MED ORDER — LABETALOL HCL 200 MG PO TABS
200.0000 mg | ORAL_TABLET | Freq: Three times a day (TID) | ORAL | Status: DC
  Administered 2013-11-10 (×2): 200 mg via ORAL
  Filled 2013-11-10 (×3): qty 1

## 2013-11-10 NOTE — Progress Notes (Signed)
Pt seen and agree with above. Will increase labetalol to 200mg  TID.  Bps are elevated. Pt is asymptomatic

## 2013-11-10 NOTE — Progress Notes (Signed)
Ur chart review completed.  

## 2013-11-10 NOTE — Progress Notes (Signed)
Hospital day # 5 pregnancy at 4322w0d--PreEclampsia.  S:  Doing well, reports good fetal activity.  Questions regarding induction and delivery.      O: BP 159/98  Pulse 72  Temp(Src) 99.7 F (37.6 C) (Oral)  Resp 18  Ht 5\' 4"  (1.626 m)  Wt 138 lb 14.4 oz (63.005 kg)  BMI 23.83 kg/m2  SpO2 99%      Fetal tracings: Reactive NST       Contractions:   None      Uterus non-tender      Extremities: no significant edema and no signs of DVT          Labs:   Results for orders placed during the hospital encounter of 11/05/13 (from the past 24 hour(s))  CBC     Status: Abnormal   Collection Time    11/10/13  7:24 AM      Result Value Ref Range   WBC 13.7 (*) 4.0 - 10.5 K/uL   RBC 3.63 (*) 3.87 - 5.11 MIL/uL   Hemoglobin 11.3 (*) 12.0 - 15.0 g/dL   HCT 16.134.2 (*) 09.636.0 - 04.546.0 %   MCV 94.2  78.0 - 100.0 fL   MCH 31.1  26.0 - 34.0 pg   MCHC 33.0  30.0 - 36.0 g/dL   RDW 40.914.9  81.111.5 - 91.415.5 %   Platelets 159  150 - 400 K/uL  COMPREHENSIVE METABOLIC PANEL     Status: Abnormal   Collection Time    11/10/13  7:24 AM      Result Value Ref Range   Sodium 137  137 - 147 mEq/L   Potassium 4.4  3.7 - 5.3 mEq/L   Chloride 105  96 - 112 mEq/L   CO2 20  19 - 32 mEq/L   Glucose, Bld 83  70 - 99 mg/dL   BUN 17  6 - 23 mg/dL   Creatinine, Ser 7.820.66  0.50 - 1.10 mg/dL   Calcium 8.1 (*) 8.4 - 10.5 mg/dL   Total Protein 6.1  6.0 - 8.3 g/dL   Albumin 2.4 (*) 3.5 - 5.2 g/dL   AST 63 (*) 0 - 37 U/L   ALT 75 (*) 0 - 35 U/L   Alkaline Phosphatase 106  39 - 117 U/L   Total Bilirubin <0.2 (*) 0.3 - 1.2 mg/dL   GFR calc non Af Amer >90  >90 mL/min   GFR calc Af Amer >90  >90 mL/min          Meds: Labetalol 200mg  TID  A: 322w0d with PreEclampsia      Betamethasone Completed 11/06/13     Stable, unchanged  P: Labetalol increased from 200mg  BID to 200mg  TID NST BID Plan to deliver via IOL at 34 weeks Continue current plan of care  MDs will follow Consulted with Dr. Audree CamelN. Dillard who agrees with  plan  Analyah Mcconnon LYNN CNM, [place] 11/10/2013 11:22 AM

## 2013-11-11 LAB — COMPREHENSIVE METABOLIC PANEL
ALK PHOS: 110 U/L (ref 39–117)
ALT: 82 U/L — ABNORMAL HIGH (ref 0–35)
AST: 59 U/L — ABNORMAL HIGH (ref 0–37)
Albumin: 2.3 g/dL — ABNORMAL LOW (ref 3.5–5.2)
BUN: 16 mg/dL (ref 6–23)
CALCIUM: 8.9 mg/dL (ref 8.4–10.5)
CO2: 19 mEq/L (ref 19–32)
Chloride: 103 mEq/L (ref 96–112)
Creatinine, Ser: 0.69 mg/dL (ref 0.50–1.10)
GFR calc non Af Amer: >90 mL/min (ref >90)
Glucose, Bld: 85 mg/dL (ref 70–99)
Potassium: 4.6 mEq/L (ref 3.7–5.3)
Sodium: 136 mEq/L — ABNORMAL LOW (ref 137–147)
Total Bilirubin: 0.2 mg/dL — ABNORMAL LOW (ref 0.3–1.2)
Total Protein: 5.6 g/dL — ABNORMAL LOW (ref 6.0–8.3)

## 2013-11-11 LAB — CBC
HCT: 33.7 % — ABNORMAL LOW (ref 36.0–46.0)
Hemoglobin: 11.4 g/dL — ABNORMAL LOW (ref 12.0–15.0)
MCH: 31.6 pg (ref 26.0–34.0)
MCHC: 33.8 g/dL (ref 30.0–36.0)
MCV: 93.4 fL (ref 78.0–100.0)
Platelets: 157 10*3/uL (ref 150–400)
RBC: 3.61 MIL/uL — ABNORMAL LOW (ref 3.87–5.11)
RDW: 15 % (ref 11.5–15.5)
WBC: 14.3 10*3/uL — ABNORMAL HIGH (ref 4.0–10.5)

## 2013-11-11 LAB — PROTEIN / CREATININE RATIO, URINE
Creatinine, Urine: 23.62 mg/dL
PROTEIN CREATININE RATIO: 1.44 — AB (ref 0.00–0.15)
Total Protein, Urine: 33.9 mg/dL

## 2013-11-11 MED ORDER — LIDOCAINE HCL (PF) 1 % IJ SOLN: 30.0000 mL | INTRAMUSCULAR | Status: DC | PRN

## 2013-11-11 MED ORDER — FLEET ENEMA 7-19 GM/118ML RE ENEM: 1.0000 | ENEMA | RECTAL | Status: DC | PRN

## 2013-11-11 MED ORDER — LABETALOL HCL 200 MG PO TABS
200.0000 mg | ORAL_TABLET | Freq: Three times a day (TID) | ORAL | Status: DC
  Administered 2013-11-11: 200 mg via ORAL
  Filled 2013-11-11: qty 1

## 2013-11-11 MED ORDER — OXYTOCIN 40 UNITS IN LACTATED RINGERS INFUSION - SIMPLE MED: 62.5000 mL/h | INTRAVENOUS | Status: DC

## 2013-11-11 MED ORDER — LABETALOL HCL 200 MG PO TABS
200.0000 mg | ORAL_TABLET | Freq: Four times a day (QID) | ORAL | Status: DC
  Administered 2013-11-11 – 2013-11-15 (×15): 200 mg via ORAL
  Filled 2013-11-11 (×24): qty 1

## 2013-11-11 MED ORDER — LABETALOL HCL 200 MG PO TABS
200.0000 mg | ORAL_TABLET | Freq: Once | ORAL | Status: DC
  Filled 2013-11-11: qty 1

## 2013-11-11 MED ORDER — MISOPROSTOL 25 MCG QUARTER TABLET
25.0000 ug | ORAL_TABLET | ORAL | Status: DC | PRN
  Administered 2013-11-11 – 2013-11-12 (×3): 25 ug via VAGINAL
  Filled 2013-11-11 (×3): qty 0.25

## 2013-11-11 MED ORDER — ONDANSETRON HCL 4 MG/2ML IJ SOLN: 4.0000 mg | Freq: Four times a day (QID) | INTRAMUSCULAR | Status: DC | PRN

## 2013-11-11 MED ORDER — LACTATED RINGERS IV SOLN
INTRAVENOUS | Status: DC
  Administered 2013-11-12 (×3): via INTRAVENOUS

## 2013-11-11 MED ORDER — LABETALOL HCL 200 MG PO TABS
400.0000 mg | ORAL_TABLET | Freq: Once | ORAL | Status: DC
  Filled 2013-11-11: qty 2

## 2013-11-11 MED ORDER — MAGNESIUM SULFATE 40 G IN LACTATED RINGERS - SIMPLE
2.0000 g/h | INTRAVENOUS | Status: DC
  Administered 2013-11-12: 1 g/h via INTRAVENOUS
  Filled 2013-11-11 (×2): qty 500

## 2013-11-11 MED ORDER — HYDRALAZINE HCL 20 MG/ML IJ SOLN
5.0000 mg | Freq: Once | INTRAMUSCULAR | Status: AC
  Administered 2013-11-11: 5 mg via INTRAVENOUS
  Filled 2013-11-11: qty 1

## 2013-11-11 MED ORDER — IBUPROFEN 600 MG PO TABS: 600.0000 mg | ORAL_TABLET | Freq: Four times a day (QID) | ORAL | Status: DC | PRN

## 2013-11-11 MED ORDER — LABETALOL HCL 200 MG PO TABS: 400.0000 mg | ORAL_TABLET | Freq: Three times a day (TID) | ORAL | Status: DC

## 2013-11-11 MED ORDER — LABETALOL HCL 5 MG/ML IV SOLN
10.0000 mg | INTRAVENOUS | Status: DC | PRN
  Administered 2013-11-12: 20 mg via INTRAVENOUS
  Administered 2013-11-12: 10 mg via INTRAVENOUS
  Filled 2013-11-11 (×2): qty 4

## 2013-11-11 MED ORDER — ACETAMINOPHEN 325 MG PO TABS
650.0000 mg | ORAL_TABLET | ORAL | Status: DC | PRN
  Administered 2013-11-11 – 2013-11-12 (×2): 650 mg via ORAL
  Filled 2013-11-11 (×2): qty 2

## 2013-11-11 MED ORDER — MAGNESIUM SULFATE 4000MG/100ML IJ SOLN: 4.0000 g | Freq: Once | INTRAMUSCULAR | Status: DC

## 2013-11-11 MED ORDER — LACTATED RINGERS IV SOLN: 500.0000 mL | INTRAVENOUS | Status: DC | PRN

## 2013-11-11 MED ORDER — DEXTROSE 5 % IV SOLN
5.0000 10*6.[IU] | Freq: Once | INTRAVENOUS | Status: AC
  Administered 2013-11-12: 5 10*6.[IU] via INTRAVENOUS
  Filled 2013-11-11: qty 5

## 2013-11-11 MED ORDER — TERBUTALINE SULFATE 1 MG/ML IJ SOLN: 0.2500 mg | Freq: Once | INTRAMUSCULAR | Status: AC | PRN

## 2013-11-11 MED ORDER — MAGNESIUM SULFATE 40 G IN LACTATED RINGERS - SIMPLE: 2.0000 g/h | INTRAVENOUS | Status: DC

## 2013-11-11 MED ORDER — DEXTROSE 5 % IV SOLN
2.5000 10*6.[IU] | INTRAVENOUS | Status: DC
  Administered 2013-11-12 (×2): 2.5 10*6.[IU] via INTRAVENOUS
  Filled 2013-11-11 (×5): qty 2.5

## 2013-11-11 MED ORDER — OXYTOCIN BOLUS FROM INFUSION: 500.0000 mL | INTRAVENOUS | Status: DC

## 2013-11-11 MED ORDER — FENTANYL CITRATE 0.05 MG/ML IJ SOLN: 100.0000 ug | INTRAMUSCULAR | Status: DC | PRN

## 2013-11-11 MED ORDER — OXYCODONE-ACETAMINOPHEN 5-325 MG PO TABS: 1.0000 | ORAL_TABLET | ORAL | Status: DC | PRN

## 2013-11-11 MED ORDER — MAGNESIUM SULFATE BOLUS VIA INFUSION
4.0000 g | Freq: Once | INTRAVENOUS | Status: AC
  Administered 2013-11-11: 4 g via INTRAVENOUS
  Filled 2013-11-11: qty 500

## 2013-11-11 MED ORDER — CITRIC ACID-SODIUM CITRATE 334-500 MG/5ML PO SOLN
30.0000 mL | ORAL | Status: DC | PRN
  Administered 2013-11-12: 30 mL via ORAL
  Filled 2013-11-11: qty 15

## 2013-11-11 MED ORDER — PANTOPRAZOLE SODIUM 40 MG PO TBEC
40.0000 mg | DELAYED_RELEASE_TABLET | Freq: Every day | ORAL | Status: DC
  Administered 2013-11-11 – 2013-11-15 (×2): 40 mg via ORAL
  Filled 2013-11-11 (×7): qty 1

## 2013-11-11 NOTE — Progress Notes (Signed)
Pt. Transferred to room 171 for induction due to elevated BP and MFM's recommendation

## 2013-11-11 NOTE — Progress Notes (Signed)
BP continue to increase with more difficulty controlling despite increase in Labetalol PCR now 1.44 with Creatinine at 0.69 LFT are 1.5-2 X upper limit of normal Patient with recurrent mild headache 2-3/10 Reviewed status with Dr Otho PerlNitsche: recommends to proceed with delivery  Reviewed with patient and husband who are agreeable to proceed

## 2013-11-11 NOTE — Progress Notes (Signed)
Antenatal Nutrition Assessment:  Currently  33 1/[redacted] weeks gestation, with HTN. Height  64 "  Weight 138 lbs  pre-pregnancy weight 125 lbs .  Pre-pregnancy  BMI 23.7  IBW 120 lbs Total weight gain 13.lbs Weight gain goals 25-35 lbs Estimated needs: 17-1900 kcal/day, 63-73 grams protein/day, 2 liters fluid/day  Regular diet tolerated well, appetite good. Snacks TID Current diet prescription will provide for increased needs.  No abnormal nutrition related labs  Nutrition Dx: Increased nutrient needs r/t pregnancy and fetal growth requirements aeb [redacted] weeks gestation.  No educational needs assessed at this time.  Elisabeth CaraKatherine Eiley Mcginnity M.Odis LusterEd. R.D. LDN Neonatal Nutrition Support Specialist Pager [phone]

## 2013-11-11 NOTE — Progress Notes (Signed)
Hospital day # 6 pregnancy at [redacted]w[redacted]d with severe pre-eclampsia  S: Had elevations of 74BP during the night ad 180/110. Received 1 dose of hydralazine and Labetalol was increased to 200 mg QID: much better since.       Current BP 127/89      well, reports good fetal activity has a mild headache which responded to Tylenol. No visual symptoms. Some GERD      Contractions:none      Vaginal bleeding:none now       Vaginal discharge: no significant change  O: BP 127/89  Pulse 88  Temp(Src) 98.1 F (36.7 C) (Oral)  Resp 18  Ht 5\' 4"  (1.626 m)  Wt 140 lb (63.504 kg)  BMI 24.02 kg/m2  SpO2 99%      Fetal tracings:reviewed and reassuring      Uterus gravid and non-tender      Extremities: no significant edema and no signs of DVT  DTR 1/4  No clonus  Results for [name], Emily (MRN [id number]) as of 11/11/2013 12:06  Ref. Range 11/11/2013 05:58  Sodium Latest Range: 137-147 mEq/L 136 (L)  Potassium Latest Range: 3.7-5.3 mEq/L 4.6  Chloride Latest Range: 96-112 mEq/L 103  CO2 Latest Range: 19-32 mEq/L 19  BUN Latest Range: 6-23 mg/dL 16  Creatinine Latest Range: 0.50-1.10 mg/dL 4.090.69  Calcium Latest Range: 8.4-10.5 mg/dL 8.9  GFR calc non Af Amer Latest Range: >90 mL/min >90  GFR calc Af Amer Latest Range: >90 mL/min >90  Glucose Latest Range: 70-99 mg/dL 85  Alkaline Phosphatase Latest Range: 39-117 U/L 110  Albumin Latest Range: 3.5-5.2 g/dL 2.3 (L)  AST Latest Range: 0-37 U/L 59 (H)  ALT Latest Range: 0-35 U/L 82 (H)  Total Protein Latest Range: 6.0-8.3 g/dL 5.6 (L)  Total Bilirubin Latest Range: 0.3-1.2 mg/dL 0.2 (L)  WBC Latest Range: 4.0-10.5 K/uL 14.3 (H)  RBC Latest Range: 3.87-5.11 MIL/uL 3.61 (L)  Hemoglobin Latest Range: 12.0-15.0 g/dL 81.111.4 (L)  HCT Latest Range: 36.0-46.0 % 33.7 (L)  MCV Latest Range: 78.0-100.0 fL 93.4  MCH Latest Range: 26.0-34.0 pg 31.6  MCHC Latest Range: 30.0-36.0 g/dL 91.433.8  RDW Latest Range: 11.5-15.5 % 15.0  Platelets Latest Range: 150-400 K/uL 157     A: 3764w1d with severe pre-eclampsia     stable  P: continue current plan of care  Alvie Speltz A  MD 11/11/2013 12:08 PM

## 2013-11-11 NOTE — Progress Notes (Signed)
S:  Reports mild HA.  Patient currently eating in hopes of alleviating HA. Good fetal movement.  Denies contractions, LOF, and VB  O: BP 149/106  Pulse 76  Temp(Src) 98.1 F (36.7 C) (Oral)  Resp 18  Ht 5\' 4"  (1.626 m)  Wt 140 lb (63.504 kg)  BMI 24.02 kg/m2  SpO2 99%      Extremities: Skin mottling in BLE, Negative Clonus,  Reflexes; RLE-1+ & LLE 2+ and no significant edema and no signs of DVT          Labs:  PC Ratio at 1.44       Meds: Labetalol 200mg  QID, Last dose at 1550  A: IUP at 4326w1d PreEclampsia Gradually worsening  P:Patient informed of change in status and need for further consult with Dr. Kathie RhodesS. Rivard.   Dr.S. Estanislado Pandyivard consulted-No new orders now--will further consult with MFM for POC Tylenol for HA Continue to monitor  Emily Turner CNM, [place] 11/11/2013 6:06 PM

## 2013-11-11 NOTE — Progress Notes (Addendum)
  Subjective: Resting on right side--husband at bedside.  Reports mild frontal HA, thinks sinus related.  Objective: BP 163/109  Pulse 79  Temp(Src) 97.9 F (36.6 C) (Oral)  Resp 20  Ht 5\' 4"  (1.626 m)  Wt 140 lb (63.504 kg)  BMI 24.02 kg/m2  SpO2 98% I/O last 3 completed shifts: In: 3723.5 [P.O.:3650; I.V.:42.5; Other:31] Out: 2400 [Urine:2400] Total I/O In: 91.7 [I.V.:91.7] Out: 950 [Urine:950]  Filed Vitals:   11/11/13 2100 11/11/13 2201 11/11/13 2202 11/11/13 2313  BP:   143/89 163/109  Pulse:  88 82 79  Temp: 97.9 F (36.6 C)     TempSrc: Oral     Resp: 20  20 20   Height:      Weight:      SpO2:       Just received PO Labetalol 200 mg dose at 2311   FHT: Category 1 UC:   Occasional, mild SVE:   Deferred at present  Assessment:  Induction for pre-eclampsia S/p initial dose of Cytotech at 2030  Plan: Observe BP s/p po Labetalol dose.  If still elevated in 1 hour, will give IV labetalol per BP parameters. Repeat Cytotech q 4 hours (ND due at 0030) Tylenol for HA  Emily Turner 11/11/2013, 11:27 PM

## 2013-11-11 NOTE — Progress Notes (Signed)
  Subjective: Now on Birthing Suite--husband at bedside.    Objective: BP 137/105  Pulse 88  Temp(Src) 98.1 F (36.7 C) (Oral)  Resp 20  Ht 5\' 4"  (1.626 m)  Wt 140 lb (63.504 kg)  BMI 24.02 kg/m2  SpO2 98% I/O last 3 completed shifts: In: 3723.5 [P.O.:3650; I.V.:42.5; Other:31] Out: 2400 [Urine:2400] Total I/O In: -  Out: 150 [Urine:150]  Filed Vitals:   11/11/13 1913 11/11/13 1918 11/11/13 2003 11/11/13 2030  BP:   140/93 137/105  Pulse: 87 81 88 88  Temp:      TempSrc:      Resp:      Height:      Weight:      SpO2: 98% 98%       FHT:  Category 1 UC:  Occasional, mild SVE:   Dilation: 1 Effacement (%): 50 Station: -2 Exam by:: Gracelyn Nursergia Whitfield RN 1st Cytotech placed by RN at 2030  Mag bolus completed, now on 2 gm/hr.  Assessment:  IUP at 33 1/7 weeks Severe pre-eclampsia Unknown GBS  Plan: Reviewed plan of care with patient and husband, including use of Magnesium through 24 hours post-delivery, treatment of elevated BP prn with labetalol IV, use of Cytotech for cervical ripening, pain med use during labor.  Patient and husband agreeable with plan, and patient is willing to use epidural if needed for labor/BP support. Plan Cytotech q 4 hours prn, pitocin when appropriate. Plan GBS prophylaxis for unknown GBS in this preterm fetus--will begin with onset of active labor or with initiation of pitocin.  Emily Turner, Emily Turner 11/11/2013, 9:09 PM

## 2013-11-12 ENCOUNTER — Encounter (HOSPITAL_COMMUNITY): Payer: BC Managed Care – PPO | Admitting: Anesthesiology

## 2013-11-12 ENCOUNTER — Encounter (HOSPITAL_COMMUNITY): Payer: Self-pay | Admitting: Anesthesiology

## 2013-11-12 ENCOUNTER — Encounter (HOSPITAL_COMMUNITY)
Admission: AD | Disposition: A | Discharge status: Home / Self Care | Payer: Self-pay | Source: Ambulatory Visit | Attending: Obstetrics and Gynecology

## 2013-11-12 ENCOUNTER — Inpatient Hospital Stay (HOSPITAL_COMMUNITY): Payer: BC Managed Care – PPO | Admitting: Anesthesiology

## 2013-11-12 DIAGNOSIS — Z98891 History of uterine scar from previous surgery: Secondary | ICD-10-CM

## 2013-11-12 DIAGNOSIS — O1490 Unspecified pre-eclampsia, unspecified trimester: Secondary | ICD-10-CM | POA: Diagnosis present

## 2013-11-12 LAB — COMPREHENSIVE METABOLIC PANEL
ALK PHOS: 114 U/L (ref 39–117)
ALT: 84 U/L — ABNORMAL HIGH (ref 0–35)
ALT: 127 U/L — ABNORMAL HIGH (ref 0–35)
AST: 61 U/L — ABNORMAL HIGH (ref 0–37)
AST: 110 U/L — ABNORMAL HIGH (ref 0–37)
Albumin: 2.4 g/dL — ABNORMAL LOW (ref 3.5–5.2)
Albumin: 2.4 g/dL — ABNORMAL LOW (ref 3.5–5.2)
Alkaline Phosphatase: 118 U/L — ABNORMAL HIGH (ref 39–117)
BILIRUBIN TOTAL: 0.3 mg/dL (ref 0.3–1.2)
BUN: 11 mg/dL (ref 6–23)
BUN: 12 mg/dL (ref 6–23)
CHLORIDE: 100 meq/L (ref 96–112)
CHLORIDE: 99 meq/L (ref 96–112)
CO2: 21 mEq/L (ref 19–32)
CO2: 20 meq/L (ref 19–32)
CREATININE: 0.63 mg/dL (ref 0.50–1.10)
Calcium: 7.1 mg/dL — ABNORMAL LOW (ref 8.4–10.5)
Calcium: 6.5 mg/dL — ABNORMAL LOW (ref 8.4–10.5)
Creatinine, Ser: 0.64 mg/dL (ref 0.50–1.10)
GFR calc Af Amer: >90 mL/min (ref >90)
GLUCOSE: 111 mg/dL — AB (ref 70–99)
Glucose, Bld: 100 mg/dL — ABNORMAL HIGH (ref 70–99)
POTASSIUM: 4.1 meq/L (ref 3.7–5.3)
Potassium: 4.6 mEq/L (ref 3.7–5.3)
Sodium: 134 mEq/L — ABNORMAL LOW (ref 137–147)
Sodium: 132 mEq/L — ABNORMAL LOW (ref 137–147)
Total Bilirubin: 0.3 mg/dL (ref 0.3–1.2)
Total Protein: 6.4 g/dL (ref 6.0–8.3)
Total Protein: 6 g/dL (ref 6.0–8.3)

## 2013-11-12 LAB — CBC
HCT: 35.3 % — ABNORMAL LOW (ref 36.0–46.0)
HCT: 34.4 % — ABNORMAL LOW (ref 36.0–46.0)
HEMOGLOBIN: 12 g/dL (ref 12.0–15.0)
HEMOGLOBIN: 12 g/dL (ref 12.0–15.0)
MCH: 31.3 pg (ref 26.0–34.0)
MCH: 32.1 pg (ref 26.0–34.0)
MCHC: 34 g/dL (ref 30.0–36.0)
MCHC: 34.9 g/dL (ref 30.0–36.0)
MCV: 91.9 fL (ref 78.0–100.0)
MCV: 92 fL (ref 78.0–100.0)
Platelets: 143 10*3/uL — ABNORMAL LOW (ref 150–400)
Platelets: 122 10*3/uL — ABNORMAL LOW (ref 150–400)
RBC: 3.84 MIL/uL — ABNORMAL LOW (ref 3.87–5.11)
RBC: 3.74 MIL/uL — AB (ref 3.87–5.11)
RDW: 14.9 % (ref 11.5–15.5)
RDW: 14.7 % (ref 11.5–15.5)
WBC: 13.2 10*3/uL — AB (ref 4.0–10.5)
WBC: 15.2 10*3/uL — AB (ref 4.0–10.5)

## 2013-11-12 LAB — URIC ACID
URIC ACID, SERUM: 3.8 mg/dL (ref 2.4–7.0)
URIC ACID, SERUM: 4 mg/dL (ref 2.4–7.0)

## 2013-11-12 LAB — MAGNESIUM: MAGNESIUM: 7.2 mg/dL — AB (ref 1.5–2.5)

## 2013-11-12 LAB — LACTATE DEHYDROGENASE: LDH: 284 U/L — AB (ref 94–250)

## 2013-11-12 SURGERY — Surgical Case
Anesthesia: Spinal | Site: Abdomen

## 2013-11-12 MED ORDER — SENNOSIDES-DOCUSATE SODIUM 8.6-50 MG PO TABS
2.0000 | ORAL_TABLET | ORAL | Status: DC
  Administered 2013-11-13 – 2013-11-14 (×3): 2 via ORAL
  Filled 2013-11-12 (×3): qty 2

## 2013-11-12 MED ORDER — FENTANYL CITRATE 0.05 MG/ML IJ SOLN
INTRAMUSCULAR | Status: DC | PRN
  Administered 2013-11-12: 25 ug via INTRATHECAL

## 2013-11-12 MED ORDER — MENTHOL 3 MG MT LOZG
1.0000 | LOZENGE | OROMUCOSAL | Status: DC | PRN
Start: 2013-11-12 — End: 2013-11-15

## 2013-11-12 MED ORDER — MEDROXYPROGESTERONE ACETATE 150 MG/ML IM SUSP: 150.0000 mg | INTRAMUSCULAR | Status: DC | PRN

## 2013-11-12 MED ORDER — SIMETHICONE 80 MG PO CHEW
80.0000 mg | CHEWABLE_TABLET | Freq: Three times a day (TID) | ORAL | Status: DC
  Administered 2013-11-14 – 2013-11-15 (×3): 80 mg via ORAL
  Filled 2013-11-12 (×4): qty 1

## 2013-11-12 MED ORDER — DIPHENHYDRAMINE HCL 50 MG/ML IJ SOLN: 12.5000 mg | INTRAMUSCULAR | Status: DC | PRN

## 2013-11-12 MED ORDER — BUPIVACAINE-EPINEPHRINE 0.5% -1:200000 IJ SOLN
INTRAMUSCULAR | Status: DC | PRN
  Administered 2013-11-12: 10 mL

## 2013-11-12 MED ORDER — TERBUTALINE SULFATE 1 MG/ML IJ SOLN: 0.2500 mg | Freq: Once | INTRAMUSCULAR | Status: DC | PRN

## 2013-11-12 MED ORDER — PHENYLEPHRINE HCL 10 MG/ML IJ SOLN
INTRAMUSCULAR | Status: DC | PRN
  Administered 2013-11-12 (×3): 40 ug via INTRAVENOUS

## 2013-11-12 MED ORDER — NALOXONE HCL 0.4 MG/ML IJ SOLN: 0.4000 mg | INTRAMUSCULAR | Status: DC | PRN

## 2013-11-12 MED ORDER — OXYTOCIN 10 UNIT/ML IJ SOLN
INTRAMUSCULAR | Status: AC
  Filled 2013-11-12: qty 4

## 2013-11-12 MED ORDER — ONDANSETRON HCL 4 MG/2ML IJ SOLN: 4.0000 mg | INTRAMUSCULAR | Status: DC | PRN

## 2013-11-12 MED ORDER — DIPHENHYDRAMINE HCL 25 MG PO CAPS
25.0000 mg | ORAL_CAPSULE | ORAL | Status: DC | PRN
  Filled 2013-11-12: qty 1

## 2013-11-12 MED ORDER — FENTANYL CITRATE 0.05 MG/ML IJ SOLN
INTRAMUSCULAR | Status: DC | PRN
  Administered 2013-11-12 (×3): 25 ug via INTRAVENOUS

## 2013-11-12 MED ORDER — DIPHENHYDRAMINE HCL 50 MG/ML IJ SOLN: 25.0000 mg | INTRAMUSCULAR | Status: DC | PRN

## 2013-11-12 MED ORDER — NALBUPHINE HCL 10 MG/ML IJ SOLN: 5.0000 mg | INTRAMUSCULAR | Status: DC | PRN

## 2013-11-12 MED ORDER — ONDANSETRON HCL 4 MG/2ML IJ SOLN
INTRAMUSCULAR | Status: DC | PRN
  Administered 2013-11-12: 4 mg via INTRAVENOUS

## 2013-11-12 MED ORDER — METOCLOPRAMIDE HCL 5 MG/ML IJ SOLN: 10.0000 mg | Freq: Three times a day (TID) | INTRAMUSCULAR | Status: DC | PRN

## 2013-11-12 MED ORDER — MORPHINE SULFATE (PF) 0.5 MG/ML IJ SOLN
INTRAMUSCULAR | Status: DC | PRN
  Administered 2013-11-12: .15 mg via INTRATHECAL

## 2013-11-12 MED ORDER — MEASLES, MUMPS & RUBELLA VAC ~~LOC~~ INJ: 0.5000 mL | INJECTION | Freq: Once | SUBCUTANEOUS | Status: DC

## 2013-11-12 MED ORDER — WITCH HAZEL-GLYCERIN EX PADS: 1.0000 "application " | MEDICATED_PAD | CUTANEOUS | Status: DC | PRN

## 2013-11-12 MED ORDER — DIBUCAINE 1 % RE OINT
1.0000 | TOPICAL_OINTMENT | RECTAL | Status: DC | PRN
Start: 2013-11-12 — End: 2013-11-15

## 2013-11-12 MED ORDER — SODIUM CHLORIDE 0.9 % IJ SOLN: 3.0000 mL | INTRAMUSCULAR | Status: DC | PRN

## 2013-11-12 MED ORDER — SIMETHICONE 80 MG PO CHEW
80.0000 mg | CHEWABLE_TABLET | ORAL | Status: DC
  Administered 2013-11-14: 80 mg via ORAL
  Filled 2013-11-12: qty 1

## 2013-11-12 MED ORDER — OXYCODONE-ACETAMINOPHEN 5-325 MG PO TABS
1.0000 | ORAL_TABLET | ORAL | Status: DC | PRN
  Administered 2013-11-13: 2 via ORAL
  Administered 2013-11-13: 1 via ORAL
  Administered 2013-11-14 – 2013-11-15 (×7): 2 via ORAL
  Filled 2013-11-12 (×8): qty 2
  Filled 2013-11-12: qty 1

## 2013-11-12 MED ORDER — DEXTROSE 5 % IV SOLN: 1.0000 ug/kg/h | INTRAVENOUS | Status: DC | PRN

## 2013-11-12 MED ORDER — LACTATED RINGERS IV SOLN
INTRAVENOUS | Status: DC
  Administered 2013-11-13 (×3): via INTRAVENOUS

## 2013-11-12 MED ORDER — SIMETHICONE 80 MG PO CHEW: 80.0000 mg | CHEWABLE_TABLET | ORAL | Status: DC | PRN

## 2013-11-12 MED ORDER — SCOPOLAMINE 1 MG/3DAYS TD PT72
1.0000 | MEDICATED_PATCH | Freq: Once | TRANSDERMAL | Status: AC
  Administered 2013-11-12: 1.5 mg via TRANSDERMAL

## 2013-11-12 MED ORDER — ZOLPIDEM TARTRATE 5 MG PO TABS
5.0000 mg | ORAL_TABLET | Freq: Every evening | ORAL | Status: DC | PRN
  Administered 2013-11-13: 5 mg via ORAL
  Filled 2013-11-12: qty 1

## 2013-11-12 MED ORDER — NALBUPHINE HCL 10 MG/ML IJ SOLN
INTRAMUSCULAR | Status: AC
  Administered 2013-11-12: 10 mg via SUBCUTANEOUS
  Filled 2013-11-12: qty 1

## 2013-11-12 MED ORDER — OXYTOCIN 10 UNIT/ML IJ SOLN
40.0000 [IU] | INTRAVENOUS | Status: DC | PRN
  Administered 2013-11-12: 40 [IU] via INTRAVENOUS

## 2013-11-12 MED ORDER — LABETALOL HCL 5 MG/ML IV SOLN: 10.0000 mg | INTRAVENOUS | Status: DC | PRN

## 2013-11-12 MED ORDER — PRENATAL MULTIVITAMIN CH
1.0000 | ORAL_TABLET | Freq: Every day | ORAL | Status: DC
  Administered 2013-11-13 – 2013-11-15 (×3): 1 via ORAL
  Filled 2013-11-12 (×3): qty 1

## 2013-11-12 MED ORDER — TETANUS-DIPHTH-ACELL PERTUSSIS 5-2.5-18.5 LF-MCG/0.5 IM SUSP
0.5000 mL | Freq: Once | INTRAMUSCULAR | Status: DC
  Filled 2013-11-12: qty 0.5

## 2013-11-12 MED ORDER — ONDANSETRON HCL 4 MG/2ML IJ SOLN: 4.0000 mg | Freq: Three times a day (TID) | INTRAMUSCULAR | Status: DC | PRN

## 2013-11-12 MED ORDER — OXYTOCIN 40 UNITS IN LACTATED RINGERS INFUSION - SIMPLE MED
1.0000 m[IU]/min | INTRAVENOUS | Status: DC
  Administered 2013-11-12: 1 m[IU]/min via INTRAVENOUS
  Filled 2013-11-12: qty 1000

## 2013-11-12 MED ORDER — ONDANSETRON HCL 4 MG PO TABS: 4.0000 mg | ORAL_TABLET | ORAL | Status: DC | PRN

## 2013-11-12 MED ORDER — FENTANYL CITRATE 0.05 MG/ML IJ SOLN
INTRAMUSCULAR | Status: AC
  Filled 2013-11-12: qty 2

## 2013-11-12 MED ORDER — LANOLIN HYDROUS EX OINT
1.0000 | TOPICAL_OINTMENT | CUTANEOUS | Status: DC | PRN
Start: 2013-11-12 — End: 2013-11-15

## 2013-11-12 MED ORDER — ONDANSETRON HCL 4 MG/2ML IJ SOLN
INTRAMUSCULAR | Status: AC
  Filled 2013-11-12: qty 2

## 2013-11-12 MED ORDER — FENTANYL CITRATE 0.05 MG/ML IJ SOLN: 25.0000 ug | INTRAMUSCULAR | Status: DC | PRN

## 2013-11-12 MED ORDER — MEPERIDINE HCL 25 MG/ML IJ SOLN: 6.2500 mg | INTRAMUSCULAR | Status: DC | PRN

## 2013-11-12 MED ORDER — LACTATED RINGERS IV BOLUS (SEPSIS)
500.0000 mL | Freq: Once | INTRAVENOUS | Status: AC
  Administered 2013-11-12: 1000 mL via INTRAVENOUS

## 2013-11-12 MED ORDER — ACETAMINOPHEN 325 MG PO TABS
650.0000 mg | ORAL_TABLET | ORAL | Status: DC | PRN
  Administered 2013-11-12 – 2013-11-13 (×3): 650 mg via ORAL
  Filled 2013-11-12 (×2): qty 2

## 2013-11-12 MED ORDER — BUPIVACAINE-EPINEPHRINE (PF) 0.5% -1:200000 IJ SOLN
INTRAMUSCULAR | Status: AC
  Filled 2013-11-12: qty 10

## 2013-11-12 MED ORDER — SCOPOLAMINE 1 MG/3DAYS TD PT72
MEDICATED_PATCH | TRANSDERMAL | Status: AC
  Administered 2013-11-12: 1.5 mg via TRANSDERMAL
  Filled 2013-11-12: qty 1

## 2013-11-12 MED ORDER — OXYTOCIN 40 UNITS IN LACTATED RINGERS INFUSION - SIMPLE MED: 62.5000 mL/h | INTRAVENOUS | Status: AC

## 2013-11-12 MED ORDER — MORPHINE SULFATE 0.5 MG/ML IJ SOLN
INTRAMUSCULAR | Status: AC
  Filled 2013-11-12: qty 10

## 2013-11-12 MED ORDER — 0.9 % SODIUM CHLORIDE (POUR BTL) OPTIME
TOPICAL | Status: DC | PRN
  Administered 2013-11-12: 1000 mL

## 2013-11-12 MED ORDER — NALBUPHINE HCL 10 MG/ML IJ SOLN
5.0000 mg | INTRAMUSCULAR | Status: DC | PRN
  Administered 2013-11-12: 10 mg via SUBCUTANEOUS

## 2013-11-12 MED ORDER — DIPHENHYDRAMINE HCL 25 MG PO CAPS: 25.0000 mg | ORAL_CAPSULE | Freq: Four times a day (QID) | ORAL | Status: DC | PRN

## 2013-11-12 MED ORDER — CEFAZOLIN SODIUM-DEXTROSE 2-3 GM-% IV SOLR
2.0000 g | INTRAVENOUS | Status: AC
  Administered 2013-11-12: 2 g via INTRAVENOUS

## 2013-11-12 MED ORDER — CEFAZOLIN SODIUM-DEXTROSE 2-3 GM-% IV SOLR
INTRAVENOUS | Status: AC
  Filled 2013-11-12: qty 50

## 2013-11-12 MED ORDER — BUPIVACAINE IN DEXTROSE 0.75-8.25 % IT SOLN
INTRATHECAL | Status: DC | PRN
  Administered 2013-11-12: 1.5 mL via INTRATHECAL

## 2013-11-12 MED ORDER — MAGNESIUM SULFATE 40 G IN LACTATED RINGERS - SIMPLE
1.0000 g/h | INTRAVENOUS | Status: DC
  Filled 2013-11-12: qty 500

## 2013-11-12 MED ORDER — PHENYLEPHRINE 40 MCG/ML (10ML) SYRINGE FOR IV PUSH (FOR BLOOD PRESSURE SUPPORT)
PREFILLED_SYRINGE | INTRAVENOUS | Status: AC
Start: 2013-11-12 — End: 2013-11-12
  Filled 2013-11-12: qty 5

## 2013-11-12 SURGICAL SUPPLY — 39 items
CLAMP CORD UMBIL (MISCELLANEOUS) IMPLANT
CLOTH BEACON ORANGE TIMEOUT ST (SAFETY) ×3 IMPLANT
CONTAINER PREFILL 10% NBF 15ML (MISCELLANEOUS) IMPLANT
DRAIN JACKSON PRT FLT 7MM (DRAIN) IMPLANT
DRAPE LG THREE QUARTER DISP (DRAPES) ×3 IMPLANT
DRSG OPSITE POSTOP 4X10 (GAUZE/BANDAGES/DRESSINGS) ×3 IMPLANT
DURAPREP 26ML APPLICATOR (WOUND CARE) ×3 IMPLANT
ELECT REM PT RETURN 9FT ADLT (ELECTROSURGICAL) ×3
ELECTRODE REM PT RTRN 9FT ADLT (ELECTROSURGICAL) ×1 IMPLANT
EVACUATOR SILICONE 100CC (DRAIN) IMPLANT
EXTRACTOR VACUUM M CUP 4 TUBE (SUCTIONS) IMPLANT
EXTRACTOR VACUUM M CUP 4' TUBE (SUCTIONS)
GLOVE BIOGEL PI IND STRL 8.5 (GLOVE) ×1 IMPLANT
GLOVE BIOGEL PI INDICATOR 8.5 (GLOVE) ×2
GLOVE ECLIPSE 8.0 STRL XLNG CF (GLOVE) ×6 IMPLANT
GOWN STRL REUS W/TWL LRG LVL3 (GOWN DISPOSABLE) ×9 IMPLANT
KIT ABG SYR 3ML LUER SLIP (SYRINGE) ×3 IMPLANT
NEEDLE HYPO 25X1 1.5 SAFETY (NEEDLE) ×3 IMPLANT
NEEDLE HYPO 25X5/8 SAFETYGLIDE (NEEDLE) ×3 IMPLANT
PACK C SECTION WH (CUSTOM PROCEDURE TRAY) ×3 IMPLANT
PAD ABD 7.5X8 STRL (GAUZE/BANDAGES/DRESSINGS) ×3 IMPLANT
PAD OB MATERNITY 4.3X12.25 (PERSONAL CARE ITEMS) ×3 IMPLANT
RINGERS IRRIG 1000ML POUR BTL (IV SOLUTION) IMPLANT
SPONGE GAUZE 4X4 12PLY (GAUZE/BANDAGES/DRESSINGS) ×3 IMPLANT
STAPLER VISISTAT 35W (STAPLE) IMPLANT
SUT MNCRL AB 3-0 PS2 27 (SUTURE) IMPLANT
SUT PLAIN 0 NONE (SUTURE) IMPLANT
SUT SILK 3 0 FS 1X18 (SUTURE) IMPLANT
SUT VIC AB 0 CT1 27 (SUTURE) ×4
SUT VIC AB 0 CT1 27XBRD ANBCTR (SUTURE) ×2 IMPLANT
SUT VIC AB 2-0 CTX 36 (SUTURE) ×6 IMPLANT
SUT VIC AB 3-0 CT1 27 (SUTURE)
SUT VIC AB 3-0 CT1 TAPERPNT 27 (SUTURE) IMPLANT
SUT VIC AB 3-0 SH 27 (SUTURE)
SUT VIC AB 3-0 SH 27X BRD (SUTURE) IMPLANT
SYR CONTROL 10ML LL (SYRINGE) ×3 IMPLANT
TOWEL OR 17X24 6PK STRL BLUE (TOWEL DISPOSABLE) ×3 IMPLANT
TRAY FOLEY CATH 14FR (SET/KITS/TRAYS/PACK) ×3 IMPLANT
WATER STERILE IRR 1000ML POUR (IV SOLUTION) IMPLANT

## 2013-11-12 NOTE — Progress Notes (Signed)
FHT have decreased variability and occasional decels. Cx unfavorable. CS recommended. The pt agrees. R and B outlined.  A.V. Stefano GaulStringer, M.D.

## 2013-11-12 NOTE — Progress Notes (Signed)
FHT now Cat 1. Continue observation.  AVStringer, MD

## 2013-11-12 NOTE — Progress Notes (Signed)
Lab called for stat lab draw.

## 2013-11-12 NOTE — Op Note (Signed)
OPERATIVE NOTE  Patient's Name: Emily EisenmengerSheena Levett  Date of Birth: 04-18-1981  Medical Records Number: [id number]  Date of Operation: 11/12/2013  Preoperative diagnosis:  [redacted]w[redacted]d weeks gestation  Severe preeclampsia  Nonreassuring fetal heart rate tracing  Postoperative diagnosis:  605w2d weeks gestation  Severe preeclampsia  Nonreassuring fetal heart rate tracing  Procedure:  Primary low transverse cesarean section  Surgeon:  Leonard SchwartzArthur Vernon Keondrick Dilks, M.D.  Assistant:  Haroldine LawsJennifer Oxley, certified nurse midwife  Anesthesia:  Spinal  Disposition:  Emily EisenmengerSheena Papania is a 33 y.o. female, G1P0, who presents at 345w2d weeks gestation. [facility]. She has the above mentioned diagnosis. Her liver enzymes have double and her platelet count has started to fall. Induction was recommended. The patient had recurrent late decelerations with Pitocin. Her Pitocin was discontinued. Her variability for her baby has decreased. It did not improve with oxygen, positioning, nor fluid bolus. Her cervix is 1 cm dilated, and long. She understands the indications for her procedure and she accepts the risk of, but not limited to, anesthetic complications, bleeding, infections, and possible damage to the surrounding organs.  Findings:  A 1760 gram female Casimiro Needle(Michael) was delivered from a occiput transverse position.  The Apgar scores were 8/9 . The uterus, fallopian tubes, and ovaries were normal for the gravid state.  The arterial cord blood pH equals 7.34.  Procedure:  The patient was taken to the operating room where a spinal anesthetic was given. The patient's abdomen was prepped with Chloraprep. The perineum was prepped with betadine. A Foley catheter was placed in the bladder. The patient was sterilely draped. A "timeout" was performed which properly identified the patient and the correct operative  procedure. The lower abdomen was injected with half percent Marcaine with epinephrine. A low transverse incision was made in the abdomen and carried sharply through the subcutaneous tissue, the fascia, and the anterior peritoneum. An incision was made in the lower uterine segment. The incision was extended in a low transverse fashion. The membranes were ruptured. The fetal head was delivered without difficulty. The mouth and nose were suctioned. The remainder of the infant was then delivered. The cord was clamped and cut. The infant was handed to the awaiting pediatric team. The placenta was removed. The uterine cavity was cleaned of amniotic fluid, clotted blood, and membranes. The uterine incision was closed using a running locking suture of 2-0 Vicryl. An imbricating suture of 2-0 Vicryl was placed. The pelvis was vigorously irrigated. Hemostasis was adequate. The anterior peritoneum and the abdominal musculature were closed using 2-0 Vicryl. The fascia was closed using a running suture of 0 Vicryl followed by 3 interrupted sutures of 0 Vicryl. The subcutaneous layer was closed using interrupted sutures of 2-0 Vicryl. The skin was reapproximated using a subcuticular suture of 3-0 Monocryl. Sponge, needle, and instrument counts were correct on 2 occasions. The estimated blood loss for the procedure was 600 cc. The patient tolerated her procedure well. She was transported to the recovery room in stable condition. The infant was taken to the NICU in stable condition. The placenta was sent to pathology.  Leonard SchwartzArthur Vernon Shane Badeaux, M.D.

## 2013-11-12 NOTE — Anesthesia Postprocedure Evaluation (Signed)
  Anesthesia Post-op Note  Patient: Emily EisenmengerSheena Turner  Procedure(s) Performed: Procedure(s): CESAREAN SECTION (N/A)  Patient Location: PACU  Anesthesia Type:Spinal  Level of Consciousness: awake, alert  and oriented  Airway and Oxygen Therapy: Patient Spontanous Breathing  Post-op Pain: none  Post-op Assessment: Post-op Vital signs reviewed, Patient's Cardiovascular Status Stable, Respiratory Function Stable, Patent Airway, No signs of Nausea or vomiting, Pain level controlled, No headache and No backache  Post-op Vital Signs: Reviewed and stable  Complications: No apparent anesthesia complications

## 2013-11-12 NOTE — Progress Notes (Signed)
BP 147/100  Pulse 80  Temp(Src) 98.5 F (36.9 C) (Oral)  Resp 16  Ht 5\' 4"  (1.626 m)  Wt 140 lb (63.504 kg)  BMI 24.02 kg/m2  SpO2 98%  Fetal tracing has moderate variability. Decels noted earlier have resolved.  O2 has been applied. Fluids given. The patient has been positioned.  Will observe for now. Cesarean section may be needed if her baby does not tolerate labor.  Dr. Stefano GaulStringer

## 2013-11-12 NOTE — Anesthesia Preprocedure Evaluation (Signed)
Anesthesia Evaluation  Patient identified by MRN, date of birth, ID band Patient awake    Reviewed: Allergy & Precautions, H&P , NPO status , Patient's Chart, lab work & pertinent test results  Airway Mallampati: III TM Distance: >3 FB Neck ROM: Full    Dental no notable dental hx. (+) Teeth Intact   Pulmonary neg pulmonary ROS,  breath sounds clear to auscultation  Pulmonary exam normal       Cardiovascular hypertension, Pt. on medications and Pt. on home beta blockers Rhythm:Regular Rate:Normal  Pre eclampsia   Neuro/Psych Anxiety negative neurological ROS     GI/Hepatic negative GI ROS, Neg liver ROS,   Endo/Other  negative endocrine ROS  Renal/GU negative Renal ROS  negative genitourinary   Musculoskeletal negative musculoskeletal ROS (+)   Abdominal   Peds  Hematology negative hematology ROS (+)   Anesthesia Other Findings   Reproductive/Obstetrics (+) Pregnancy Pre eclampsia                           Anesthesia Physical Anesthesia Plan  ASA: III and emergent  Anesthesia Plan: Spinal   Post-op Pain Management:    Induction:   Airway Management Planned: Natural Airway  Additional Equipment:   Intra-op Plan:   Post-operative Plan:   Informed Consent: I have reviewed the patients History and Physical, chart, labs and discussed the procedure including the risks, benefits and alternatives for the proposed anesthesia with the patient or authorized representative who has indicated his/her understanding and acceptance.     Plan Discussed with: Anesthesiologist, CRNA and Surgeon  Anesthesia Plan Comments:         Anesthesia Quick Evaluation

## 2013-11-12 NOTE — Progress Notes (Signed)
  Subjective: Pt seems fatigued.  Pt had a lot of questions about POC and status of baby - discussed POC and gave reassurances.  Objective: BP 155/107  Pulse 89  Temp(Src) 98.5 F (36.9 C) (Oral)  Resp 18  Ht 5\' 4"  (1.626 m)  Wt 140 lb (63.504 kg)  BMI 24.02 kg/m2  SpO2 98% I/O last 3 completed shifts: In: 4633.9 [P.O.:2600; I.V.:1752.9; Other:31; IV Piggyback:250] Out: 4625 [Urine:4625] Total I/O In: 615 [P.O.:240; I.V.:375] Out: 250 [Urine:250]  FHT:  Cat I UC:   None  SVE:   Dilation: 1 Effacement (%): 50 Station: -2 Exam by:: J Tywone Bembenek CNM  Assessment:   Induction of labor due to preeclampsia,  3 doses of Cytotec through the night, Pitocin started about 1045 after c/w Dr. Stefano GaulStringer  Labor: Feeling some mensrual-like cramps, will consider foley bulb tonight if needed per Dr. Stefano GaulStringer  Preeclampsia: on magnesium sulfate, no signs or symptoms of toxicity and intake and ouput balanced  Fetal Wellbeing: Category I  Pain Control: Labor support without medications, pt states she does not want epidural but may consider IV pain medication if she needs it. I/D: GBS unknown; PCN prophylaxis started this am; Intact; Afebrile  Anticipated MOD: NSVD   Emily Turner 11/12/2013, 10:44 AM

## 2013-11-12 NOTE — Progress Notes (Signed)
AICU RN in chart to update information - pt will be admitted to AICU following delivery - preeclampsia.

## 2013-11-12 NOTE — Progress Notes (Signed)
Pt extremely anxious and is "upset I didn't get any sleep last night". Manfred ArchV Latham and J Oxley on unit.  Updated on pt status.  Pt off monitors, may shower and eat regular breakfast.  Pt was very happy with this POC.

## 2013-11-12 NOTE — Progress Notes (Signed)
  Subjective: Sleeping soundly.  Awakened for next dose Cytotech.  Received Ambien 2312.  Objective: BP 160/97  Pulse 79  Temp(Src) 98.2 F (36.8 C) (Oral)  Resp 20  Ht 5\' 4"  (1.626 m)  Wt 140 lb (63.504 kg)  BMI 24.02 kg/m2  SpO2 98% I/O last 3 completed shifts: In: 3723.5 [P.O.:3650; I.V.:42.5; Other:31] Out: 2400 [Urine:2400] Total I/O In: 241.7 [I.V.:241.7] Out: 1475 [Urine:1475]  Filed Vitals:   11/11/13 2201 11/11/13 2202 11/11/13 2313 11/12/13 0108  BP:  143/89 163/109 160/97  Pulse: 88 82 79 79  Temp:    98.2 F (36.8 C)  TempSrc:    Oral  Resp:  20 20 20   Height:      Weight:      SpO2:         FHT:  Segments of Category 1--currently minimal variability. UC:   irregular, every 4-7 minutes, mild SVE:   Dilation: 1 Effacement (%): 70 Station: -2 Exam by:: Nigel BridgemanVicki Miu Chiong, CNM 2nd dose Cytotech placed  Assessment:  Induction for pre-eclampsia  Plan: Continue to observe. Hourly BPs Labetalol IV prn for BP parameters On magnesium 2 gm/hr.  Emily Turner 11/12/2013, 1:34 AM

## 2013-11-12 NOTE — Progress Notes (Signed)
  Subjective: RN called to notified of FHT.  Objective: BP 147/100  Pulse 80  Temp(Src) 98.5 F (36.9 C) (Oral)  Resp 16  Ht 5\' 4"  (1.626 m)  Wt 140 lb (63.504 kg)  BMI 24.02 kg/m2  SpO2 98% I/O last 3 completed shifts: In: 4633.9 [P.O.:2600; I.V.:1752.9; Other:31; IV Piggyback:250] Out: 4625 [Urine:4625] Total I/O In: 1093.1 [P.O.:240; I.V.:753.1; IV Piggyback:100] Out: 950 [Urine:950]  FHT:  Cat II UC:   regular, every 2-5 minutes   Assessment:   C/w Dr. Stefano GaulStringer  Induction of labor due to preeclampsia, Pitocin up to 4 miliU then turned off by RN d/t late decels  Labor: Pitocin off - per consult ordered O2, 500 cc bolus, and re-evaluate after 30 mins  Preeclampsia: on magnesium sulfate and no signs or symptoms of toxicity  Fetal Wellbeing: Category II  Pain Control: Labor support without medications  I/D: GBS unknown; PCN prophylaxis started this am; Intact; Afebrile    Anticipated MOD: NSVD vs C/S   Emily Turner 11/12/2013, 1:29 PM

## 2013-11-12 NOTE — Transfer of Care (Signed)
Immediate Anesthesia Transfer of Care Note  Patient: Emily EisenmengerSheena Antosh  Procedure(s) Performed: Procedure(s): CESAREAN SECTION (N/A)  Patient Location: PACU  Anesthesia Type:Spinal  Level of Consciousness: awake  Airway & Oxygen Therapy: Patient Spontanous Breathing  Post-op Assessment: Report given to PACU RN  Post vital signs: Reviewed and stable  Complications: No apparent anesthesia complications

## 2013-11-12 NOTE — Progress Notes (Signed)
Dr Stefano GaulStringer at bedside.  POC for C/S initiated.

## 2013-11-12 NOTE — Progress Notes (Signed)
Pt extremely anxious.  Comfort measures given.  Pt states "has a hard time breathing".  VSS.  Lungs CTA.   J Oxley paged to come evaluate.

## 2013-11-12 NOTE — Progress Notes (Signed)
Annamary RummageJ Oxley RN will discuss POC with Dr Stefano GaulStringer and let this RN know pitocin protocol vs cytotec.  Pt in agreement and verbalizes understanding.

## 2013-11-12 NOTE — Anesthesia Procedure Notes (Signed)
Spinal  Patient location during procedure: OR Start time: 11/12/2013 4:18 PM Staffing Anesthesiologist: Beverely Suen A. Performed by: anesthesiologist  Preanesthetic Checklist Completed: patient identified, site marked, surgical consent, pre-op evaluation, timeout performed, IV checked, risks and benefits discussed and monitors and equipment checked Spinal Block Patient position: sitting Prep: site prepped and draped and DuraPrep Patient monitoring: heart rate, cardiac monitor, continuous pulse ox and blood pressure Approach: midline Location: L3-4 Injection technique: single-shot Needle Needle type: Sprotte  Needle gauge: 24 G Needle length: 9 cm Needle insertion depth: 5 cm Assessment Sensory level: T4 Additional Notes Patient tolerated procedure well. Adequate sensory level.

## 2013-11-13 ENCOUNTER — Encounter (HOSPITAL_COMMUNITY): Payer: Self-pay | Admitting: *Deleted

## 2013-11-13 LAB — CBC
HCT: 29.4 % — ABNORMAL LOW (ref 36.0–46.0)
HEMATOCRIT: 32.3 % — AB (ref 36.0–46.0)
Hemoglobin: 9.9 g/dL — ABNORMAL LOW (ref 12.0–15.0)
Hemoglobin: 10.8 g/dL — ABNORMAL LOW (ref 12.0–15.0)
MCH: 31.1 pg (ref 26.0–34.0)
MCH: 31.3 pg (ref 26.0–34.0)
MCHC: 33.7 g/dL (ref 30.0–36.0)
MCHC: 33.4 g/dL (ref 30.0–36.0)
MCV: 92.5 fL (ref 78.0–100.0)
MCV: 93.6 fL (ref 78.0–100.0)
PLATELETS: 103 10*3/uL — AB (ref 150–400)
Platelets: 90 10*3/uL — ABNORMAL LOW (ref 150–400)
RBC: 3.18 MIL/uL — ABNORMAL LOW (ref 3.87–5.11)
RBC: 3.45 MIL/uL — ABNORMAL LOW (ref 3.87–5.11)
RDW: 14.8 % (ref 11.5–15.5)
RDW: 15.3 % (ref 11.5–15.5)
WBC: 13.3 10*3/uL — ABNORMAL HIGH (ref 4.0–10.5)
WBC: 15.4 10*3/uL — AB (ref 4.0–10.5)

## 2013-11-13 LAB — COMPREHENSIVE METABOLIC PANEL
ALBUMIN: 1.9 g/dL — AB (ref 3.5–5.2)
ALBUMIN: 2.3 g/dL — AB (ref 3.5–5.2)
ALK PHOS: 94 U/L (ref 39–117)
ALK PHOS: 117 U/L (ref 39–117)
ALT: 123 U/L — ABNORMAL HIGH (ref 0–35)
ALT: 121 U/L — AB (ref 0–35)
AST: 114 U/L — ABNORMAL HIGH (ref 0–37)
AST: 95 U/L — ABNORMAL HIGH (ref 0–37)
BILIRUBIN TOTAL: 0.4 mg/dL (ref 0.3–1.2)
BUN: 13 mg/dL (ref 6–23)
BUN: 13 mg/dL (ref 6–23)
CALCIUM: 7.3 mg/dL — AB (ref 8.4–10.5)
CO2: 23 meq/L (ref 19–32)
CO2: 24 mEq/L (ref 19–32)
CREATININE: 0.74 mg/dL (ref 0.50–1.10)
Calcium: 6.3 mg/dL — CL (ref 8.4–10.5)
Chloride: 100 mEq/L (ref 96–112)
Chloride: 99 mEq/L (ref 96–112)
Creatinine, Ser: 0.73 mg/dL (ref 0.50–1.10)
GFR calc Af Amer: >90 mL/min (ref >90)
GFR calc non Af Amer: >90 mL/min (ref >90)
GLUCOSE: 123 mg/dL — AB (ref 70–99)
Glucose, Bld: 90 mg/dL (ref 70–99)
POTASSIUM: 4.4 meq/L (ref 3.7–5.3)
Potassium: 4.5 mEq/L (ref 3.7–5.3)
SODIUM: 136 meq/L — AB (ref 137–147)
Sodium: 134 mEq/L — ABNORMAL LOW (ref 137–147)
TOTAL PROTEIN: 6.1 g/dL (ref 6.0–8.3)
Total Bilirubin: 0.3 mg/dL (ref 0.3–1.2)
Total Protein: 5 g/dL — ABNORMAL LOW (ref 6.0–8.3)

## 2013-11-13 LAB — CALCIUM, IONIZED: CALCIUM ION: 1 mmol/L — AB (ref 1.12–1.23)

## 2013-11-13 LAB — MAGNESIUM: MAGNESIUM: 6 mg/dL — AB (ref 1.5–2.5)

## 2013-11-13 NOTE — Anesthesia Postprocedure Evaluation (Signed)
  Anesthesia Post-op Note  Patient: Emily EisenmengerSheena Turner  Procedure(s) Performed: Procedure(s): CESAREAN SECTION (N/A)  Patient Location: A-ICU  Anesthesia Type:Spinal  Level of Consciousness: awake, alert  and oriented  Airway and Oxygen Therapy: Patient Spontanous Breathing  Post-op Pain: none  Post-op Assessment: Post-op Vital signs reviewed, Patient's Cardiovascular Status Stable, Respiratory Function Stable, No headache, No backache, No residual numbness and No residual motor weakness  Post-op Vital Signs: Reviewed and stable  Complications: No apparent anesthesia complications

## 2013-11-13 NOTE — Lactation Note (Signed)
This note was copied from the chart of Emily Remigio EisenmengerSheena Knudtson. Lactation Consultation Note  Patient Name: Emily Remigio EisenmengerSheena Mignano RUEAV'WToday's Date: 11/13/2013   [facility].  RN got mom pumping yesterday; mom reports getting 15 ml with first pumping but is now getting drops.  Reviewed using Preemie setting on DEBP with 3-4 teardrops and using hands-on pumping with hand expression at end of pumping session.  Taught hand expression with return demonstration and observation of colostrum easily flowing.  Mom is using #24 flanges; LC watched pumping session while doing teaching and #24 flanges fit.  Mom has private insurance; encouraged mom to contact insurance company about getting a DEBP.  Rental packet left with mom and explained how to complete information for pump rental if needed upon discharge; mom will discuss with FOB when he returns to hospital what their plans are regarding pump at discharge. Reviewed NICU booklet and informed of community/hospital support services, groups, and outpatient lactation services.  Encouraged to call for questions.    Lendon KaVann, Wojciech Willetts Walker 11/13/2013, 5:19 PM

## 2013-11-13 NOTE — Progress Notes (Signed)
Subjective:  Postpartum Day 1: Cesarean Delivery  No headaches, blurred vision, or RUQ tenderness. Good pain control..    Objective: Vital signs in last 24 hours: Temp:  [97.1 F (36.2 C)-98.6 F (37 C)] 97.9 F (36.6 C) (03/28 0300) Pulse Rate:  [54-89] 56 (03/28 0700) Resp:  [12-22] 16 (03/28 0609) BP: (104-164)/(71-114) 109/78 mmHg (03/28 0700) SpO2:  [95 %-99 %] 97 % (03/28 0700) Weight:  [135 lb 8 oz (61.462 kg)] 135 lb 8 oz (61.462 kg) (03/27 1903)   Intake/Output Summary (Last 24 hours) at 11/13/13 0814 Last data filed at 11/13/13 0653  Gross per 24 hour  Intake 3524.34 ml  Output   4050 ml  Net -525.66 ml    Intake/Output     03/27 0701 - 03/28 0700 03/28 0701 - 03/29 0700   P.O. 640    I.V. (mL/kg) 2809.3 (45.7)    Other     IV Piggyback 200    Total Intake(mL/kg) 3649.3 (59.4)    Urine (mL/kg/hr) 3450 (2.3)    Blood 600 (0.4)    Total Output 4050     Net -400.7            Physical Exam:  General: alert and no distress Lochia: appropriate Uterine Fundus: firm Incision: dressing clean and dry DVT Evaluation: No evidence of DVT seen on physical exam. Chest: Clear Heart: RRR Reflexes: Normal   Recent Labs  11/12/13 1500 11/13/13 0525  HGB 12.0 9.9*  HCT 34.4* 29.4*   Results for orders placed during the hospital encounter of 11/05/13 (from the past 24 hour(s))  CBC     Status: Abnormal   Collection Time    11/12/13  3:00 PM      Result Value Ref Range   WBC 15.2 (*) 4.0 - 10.5 K/uL   RBC 3.74 (*) 3.87 - 5.11 MIL/uL   Hemoglobin 12.0  12.0 - 15.0 g/dL   HCT 81.134.4 (*) 91.436.0 - 78.246.0 %   MCV 92.0  78.0 - 100.0 fL   MCH 32.1  26.0 - 34.0 pg   MCHC 34.9  30.0 - 36.0 g/dL   RDW 95.614.7  21.311.5 - 08.615.5 %   Platelets 122 (*) 150 - 400 K/uL  COMPREHENSIVE METABOLIC PANEL     Status: Abnormal   Collection Time    11/12/13  3:00 PM      Result Value Ref Range   Sodium 132 (*) 137 - 147 mEq/L   Potassium 4.6  3.7 - 5.3 mEq/L   Chloride 99  96 - 112 mEq/L    CO2 20  19 - 32 mEq/L   Glucose, Bld 100 (*) 70 - 99 mg/dL   BUN 12  6 - 23 mg/dL   Creatinine, Ser 5.780.64  0.50 - 1.10 mg/dL   Calcium 6.5 (*) 8.4 - 10.5 mg/dL   Total Protein 6.0  6.0 - 8.3 g/dL   Albumin 2.4 (*) 3.5 - 5.2 g/dL   AST 469110 (*) 0 - 37 U/L   ALT 127 (*) 0 - 35 U/L   Alkaline Phosphatase 118 (*) 39 - 117 U/L   Total Bilirubin 0.3  0.3 - 1.2 mg/dL   GFR calc non Af Amer >90  >90 mL/min   GFR calc Af Amer >90  >90 mL/min  URIC ACID     Status: None   Collection Time    11/12/13  3:00 PM      Result Value Ref Range   Uric Acid, Serum 4.0  2.4 - 7.0 mg/dL  MAGNESIUM     Status: Abnormal   Collection Time    11/12/13  3:00 PM      Result Value Ref Range   Magnesium 7.2 (*) 1.5 - 2.5 mg/dL  CBC     Status: Abnormal   Collection Time    11/13/13  5:25 AM      Result Value Ref Range   WBC 13.3 (*) 4.0 - 10.5 K/uL   RBC 3.18 (*) 3.87 - 5.11 MIL/uL   Hemoglobin 9.9 (*) 12.0 - 15.0 g/dL   HCT 81.1 (*) 91.4 - 78.2 %   MCV 92.5  78.0 - 100.0 fL   MCH 31.1  26.0 - 34.0 pg   MCHC 33.7  30.0 - 36.0 g/dL   RDW 95.6  21.3 - 08.6 %   Platelets 90 (*) 150 - 400 K/uL  COMPREHENSIVE METABOLIC PANEL     Status: Abnormal   Collection Time    11/13/13  5:25 AM      Result Value Ref Range   Sodium 134 (*) 137 - 147 mEq/L   Potassium 4.5  3.7 - 5.3 mEq/L   Chloride 100  96 - 112 mEq/L   CO2 23  19 - 32 mEq/L   Glucose, Bld 90  70 - 99 mg/dL   BUN 13  6 - 23 mg/dL   Creatinine, Ser 5.78  0.50 - 1.10 mg/dL   Calcium 6.3 (*) 8.4 - 10.5 mg/dL   Total Protein 5.0 (*) 6.0 - 8.3 g/dL   Albumin 1.9 (*) 3.5 - 5.2 g/dL   AST 469 (*) 0 - 37 U/L   ALT 123 (*) 0 - 35 U/L   Alkaline Phosphatase 94  39 - 117 U/L   Total Bilirubin 0.4  0.3 - 1.2 mg/dL   GFR calc non Af Amer >90  >90 mL/min   GFR calc Af Amer >90  >90 mL/min  MAGNESIUM     Status: Abnormal   Collection Time    11/13/13  5:25 AM      Result Value Ref Range   Magnesium 6.0 (*) 1.5 - 2.5 mg/dL     Assessment/Plan: Status post Cesarean section. Doing well postoperatively. Severe PreEclampsia. Increased liver enzymes. Low calcium secondary to low albumin. Discussed with Dr. Vassie Loll. Anemia. Continue current care. Repeat labs this afternoon.  Grant Henkes V 11/13/2013, 8:14 AM

## 2013-11-13 NOTE — Progress Notes (Signed)
Results for Emily Turner, Emily Turner (MRN [id number]) as of 11/13/2013 16:30  Ref. Range 11/13/2013 15:40  WBC Latest Range: 4.0-10.5 K/uL 15.4 (H)  RBC Latest Range: 3.87-5.11 MIL/uL 3.45 (L)  Hemoglobin Latest Range: 12.0-15.0 g/dL 40.910.8 (L)  HCT Latest Range: 36.0-46.0 % 32.3 (L)  MCV Latest Range: 78.0-100.0 fL 93.6  MCH Latest Range: 26.0-34.0 pg 31.3  MCHC Latest Range: 30.0-36.0 g/dL 81.133.4  RDW Latest Range: 11.5-15.5 % 15.3  Platelets Latest Range: 150-400 K/uL 103 (L)  Dr. Stefano GaulStringer made aware. No new orders received.

## 2013-11-13 NOTE — Addendum Note (Signed)
Addendum created 11/13/13 1319 by Shanon PayorSuzanne M Naoma Boxell, CRNA   Modules edited: Notes Section   Notes Section:  File: [id number]

## 2013-11-13 NOTE — Progress Notes (Signed)
Results for [name], Emily (MRN [id number]) as of 11/13/2013 06:27  Ref. Range 11/13/2013 05:25  Sodium Latest Range: 137-147 mEq/L 134 (L)  Potassium Latest Range: 3.7-5.3 mEq/L 4.5  Chloride Latest Range: 96-112 mEq/L 100  CO2 Latest Range: 19-32 mEq/L 23  BUN Latest Range: 6-23 mg/dL 13  Creatinine Latest Range: 0.50-1.10 mg/dL 0.270.74  Calcium Latest Range: 8.4-10.5 mg/dL 6.3 (LL)  GFR calc non Af Amer Latest Range: >90 mL/min >90  GFR calc Af Amer Latest Range: >90 mL/min >90  Glucose Latest Range: 70-99 mg/dL 90  Magnesium Latest Range: 1.5-2.5 mg/dL 6.0 (H)  Alkaline Phosphatase Latest Range: 39-117 U/L 94  Albumin Latest Range: 3.5-5.2 g/dL 1.9 (L)  AST Latest Range: 0-37 U/L 114 (H)  ALT Latest Range: 0-35 U/L 123 (H)  Total Protein Latest Range: 6.0-8.3 g/dL 5.0 (L)  Total Bilirubin Latest Range: 0.3-1.2 mg/dL 0.4   Critical value Calcium 6.3 called to Dr. Stefano GaulStringer

## 2013-11-13 NOTE — Progress Notes (Signed)
This note also relates to the following rows which could not be included: SpO2 - Cannot attach notes to unvalidated device data    11/13/13 1627  Vitals  BP ! 158/108 mmHg  MAP (mmHg) 121  Pulse Rate 76  Ok to go see baby in NICU via W/C

## 2013-11-14 LAB — CBC
HEMATOCRIT: 28.7 % — AB (ref 36.0–46.0)
Hemoglobin: 9.6 g/dL — ABNORMAL LOW (ref 12.0–15.0)
MCH: 31.5 pg (ref 26.0–34.0)
MCHC: 33.4 g/dL (ref 30.0–36.0)
MCV: 94.1 fL (ref 78.0–100.0)
Platelets: 97 10*3/uL — ABNORMAL LOW (ref 150–400)
RBC: 3.05 MIL/uL — ABNORMAL LOW (ref 3.87–5.11)
RDW: 15.3 % (ref 11.5–15.5)
WBC: 14.4 10*3/uL — ABNORMAL HIGH (ref 4.0–10.5)

## 2013-11-14 LAB — COMPREHENSIVE METABOLIC PANEL
ALK PHOS: 94 U/L (ref 39–117)
ALT: 84 U/L — ABNORMAL HIGH (ref 0–35)
AST: 57 U/L — ABNORMAL HIGH (ref 0–37)
Albumin: 2 g/dL — ABNORMAL LOW (ref 3.5–5.2)
BUN: 12 mg/dL (ref 6–23)
CHLORIDE: 103 meq/L (ref 96–112)
CO2: 25 mEq/L (ref 19–32)
CREATININE: 0.66 mg/dL (ref 0.50–1.10)
Calcium: 6.8 mg/dL — ABNORMAL LOW (ref 8.4–10.5)
GFR calc non Af Amer: >90 mL/min (ref >90)
Glucose, Bld: 83 mg/dL (ref 70–99)
Potassium: 4.1 mEq/L (ref 3.7–5.3)
Sodium: 136 mEq/L — ABNORMAL LOW (ref 137–147)
TOTAL PROTEIN: 5.6 g/dL — AB (ref 6.0–8.3)
Total Bilirubin: <0.2 mg/dL — ABNORMAL LOW (ref 0.3–1.2)

## 2013-11-14 NOTE — Progress Notes (Signed)
Subjective: Postpartum Day 2: Cesarean Delivery Patient reports incisional pain and tolerating PO.    Objective: Vital signs in last 24 hours: Temp:  [97.8 F (36.6 C)-98.7 F (37.1 C)] 98.3 F (36.8 C) (03/29 0513) Pulse Rate:  [25-84] 71 (03/29 0600) Resp:  [15-20] 18 (03/29 0513) BP: (117-163)/(69-109) 123/87 mmHg (03/29 0600) SpO2:  [96 %-100 %] 96 % (03/29 0600)  Physical Exam:  General: no distress Lochia: appropriate Uterine Fundus: firm Incision: dressing with a small stain. DVT Evaluation: No evidence of DVT seen on physical exam. Reflexes: Normal  Results for orders placed during the hospital encounter of 11/05/13 (from the past 24 hour(s))  CBC     Status: Abnormal   Collection Time    11/13/13  3:40 PM      Result Value Ref Range   WBC 15.4 (*) 4.0 - 10.5 K/uL   RBC 3.45 (*) 3.87 - 5.11 MIL/uL   Hemoglobin 10.8 (*) 12.0 - 15.0 g/dL   HCT 78.2 (*) 95.6 - 21.3 %   MCV 93.6  78.0 - 100.0 fL   MCH 31.3  26.0 - 34.0 pg   MCHC 33.4  30.0 - 36.0 g/dL   RDW 08.6  57.8 - 46.9 %   Platelets 103 (*) 150 - 400 K/uL  CALCIUM, IONIZED     Status: Abnormal   Collection Time    11/13/13  5:40 PM      Result Value Ref Range   Calcium, Ion 1.00 (*) 1.12 - 1.23 mmol/L  COMPREHENSIVE METABOLIC PANEL     Status: Abnormal   Collection Time    11/13/13  5:40 PM      Result Value Ref Range   Sodium 136 (*) 137 - 147 mEq/L   Potassium 4.4  3.7 - 5.3 mEq/L   Chloride 99  96 - 112 mEq/L   CO2 24  19 - 32 mEq/L   Glucose, Bld 123 (*) 70 - 99 mg/dL   BUN 13  6 - 23 mg/dL   Creatinine, Ser 6.29  0.50 - 1.10 mg/dL   Calcium 7.3 (*) 8.4 - 10.5 mg/dL   Total Protein 6.1  6.0 - 8.3 g/dL   Albumin 2.3 (*) 3.5 - 5.2 g/dL   AST 95 (*) 0 - 37 U/L   ALT 121 (*) 0 - 35 U/L   Alkaline Phosphatase 117  39 - 117 U/L   Total Bilirubin 0.3  0.3 - 1.2 mg/dL   GFR calc non Af Amer >90  >90 mL/min   GFR calc Af Amer >90  >90 mL/min  CBC     Status: Abnormal   Collection Time    11/14/13   5:10 AM      Result Value Ref Range   WBC 14.4 (*) 4.0 - 10.5 K/uL   RBC 3.05 (*) 3.87 - 5.11 MIL/uL   Hemoglobin 9.6 (*) 12.0 - 15.0 g/dL   HCT 52.8 (*) 41.3 - 24.4 %   MCV 94.1  78.0 - 100.0 fL   MCH 31.5  26.0 - 34.0 pg   MCHC 33.4  30.0 - 36.0 g/dL   RDW 01.0  27.2 - 53.6 %   Platelets 97 (*) 150 - 400 K/uL  COMPREHENSIVE METABOLIC PANEL     Status: Abnormal   Collection Time    11/14/13  5:10 AM      Result Value Ref Range   Sodium 136 (*) 137 - 147 mEq/L   Potassium 4.1  3.7 - 5.3 mEq/L  Chloride 103  96 - 112 mEq/L   CO2 25  19 - 32 mEq/L   Glucose, Bld 83  70 - 99 mg/dL   BUN 12  6 - 23 mg/dL   Creatinine, Ser 4.090.66  0.50 - 1.10 mg/dL   Calcium 6.8 (*) 8.4 - 10.5 mg/dL   Total Protein 5.6 (*) 6.0 - 8.3 g/dL   Albumin 2.0 (*) 3.5 - 5.2 g/dL   AST 57 (*) 0 - 37 U/L   ALT 84 (*) 0 - 35 U/L   Alkaline Phosphatase 94  39 - 117 U/L   Total Bilirubin <0.2 (*) 0.3 - 1.2 mg/dL   GFR calc non Af Amer >90  >90 mL/min   GFR calc Af Amer >90  >90 mL/min   I/O last 3 completed shifts: In: 6063.8 [P.O.:1918; I.V.:4145.8] Out: 7050 [Urine:7050]    Recent Labs  11/13/13 1540 11/14/13 0510  HGB 10.8* 9.6*  HCT 32.3* 28.7*    Assessment/Plan: Status post Cesarean section. Doing well postoperatively.  Resolving preeclampsia/HELLP Syndrome. Anemia.  DC magnesium. Transfer to floor. Discharge tomorrow.  Janine LimboSTRINGER,Linkin Vizzini V 11/14/2013, 7:23 AM

## 2013-11-15 ENCOUNTER — Ambulatory Visit: Payer: Self-pay

## 2013-11-15 ENCOUNTER — Encounter (HOSPITAL_COMMUNITY)
Admit: 2013-11-15 | Discharge: 2013-11-15 | Disposition: A | Discharge status: Home / Self Care | Payer: BC Managed Care – PPO | Attending: Obstetrics and Gynecology | Admitting: Obstetrics and Gynecology

## 2013-11-15 ENCOUNTER — Encounter (HOSPITAL_COMMUNITY): Payer: Self-pay | Admitting: Obstetrics and Gynecology

## 2013-11-15 LAB — COMPREHENSIVE METABOLIC PANEL
ALBUMIN: 2.3 g/dL — AB (ref 3.5–5.2)
ALT: 65 U/L — AB (ref 0–35)
AST: 37 U/L (ref 0–37)
Alkaline Phosphatase: 105 U/L (ref 39–117)
BUN: 16 mg/dL (ref 6–23)
CALCIUM: 8.4 mg/dL (ref 8.4–10.5)
CO2: 24 mEq/L (ref 19–32)
Chloride: 103 mEq/L (ref 96–112)
Creatinine, Ser: 0.59 mg/dL (ref 0.50–1.10)
GFR calc non Af Amer: >90 mL/min (ref >90)
GLUCOSE: 74 mg/dL (ref 70–99)
Potassium: 4.5 mEq/L (ref 3.7–5.3)
SODIUM: 137 meq/L (ref 137–147)
TOTAL PROTEIN: 5.8 g/dL — AB (ref 6.0–8.3)
Total Bilirubin: 0.2 mg/dL — ABNORMAL LOW (ref 0.3–1.2)

## 2013-11-15 LAB — CBC
HCT: 32.8 % — ABNORMAL LOW (ref 36.0–46.0)
Hemoglobin: 10.8 g/dL — ABNORMAL LOW (ref 12.0–15.0)
MCH: 31.5 pg (ref 26.0–34.0)
MCHC: 32.9 g/dL (ref 30.0–36.0)
MCV: 95.6 fL (ref 78.0–100.0)
PLATELETS: 132 10*3/uL — AB (ref 150–400)
RBC: 3.43 MIL/uL — ABNORMAL LOW (ref 3.87–5.11)
RDW: 15.4 % (ref 11.5–15.5)
WBC: 16.8 10*3/uL — ABNORMAL HIGH (ref 4.0–10.5)

## 2013-11-15 LAB — RPR: RPR Ser Ql: NONREACTIVE

## 2013-11-15 MED ORDER — OXYCODONE-ACETAMINOPHEN 5-325 MG PO TABS: 1.0000 | ORAL_TABLET | ORAL | Status: AC | PRN

## 2013-11-15 MED ORDER — LABETALOL HCL 200 MG PO TABS: 200.0000 mg | ORAL_TABLET | Freq: Four times a day (QID) | ORAL | Status: AC

## 2013-11-15 NOTE — Progress Notes (Signed)
To room to assess patient.  In NICU at current.  Informed and number left with Thayer Ohmhris, RN to call when patient returns for assessment and discharge.  Karel Turpen LYNN, CNM

## 2013-11-15 NOTE — Progress Notes (Signed)
Cesarean Section Delivery Discharge Summary  Emily Turner  DOB:    [birth year] MRN:    [id number] CSN:    [id number]  Date of admission:                  11/05/2013   Date of discharge:                   11/15/2013  Procedures this admission: Betamethasone dosing, Primary C/S, MgSO4,  Date of Delivery: 11/12/2013   Newborn Data:  Live born female  Birth Weight: 3 lb 14.1 oz (1760 g) APGAR: 8, 9  Infant to remain in NICU Name: Casimiro Needle Circumcision Plan: Assessed prior to infant d/c  History of Present Illness:  Emily Turner is a 33 y.o. female, G1P0101, who presents at [redacted]w[redacted]d weeks gestation. The patient has been followed at the [facility] and Gynecology division of [facility].    Her pregnancy has been complicated by: Preeclampisa.  Patient Active Problem List   Diagnosis Date Noted  . Preeclampsia 11/12/2013  . S/P cesarean section 11/12/2013  . Hypertension complicating pregnancy in third trimester 11/05/2013    Hospital course:  The patient was admitted for Elevated B/P. Patient diagnosed with PreEclampsia which gradually worsened to Severe resulting in PTD.   Her postpartum course was not complicated.  She was discharged to home on postpartum day 3 doing well.  Feeding:  breast  Contraception:  IUD  Discharge hemoglobin:  Hemoglobin  Date Value Ref Range Status  11/15/2013 10.8* 12.0 - 15.0 g/dL Final     HCT  Date Value Ref Range Status  11/15/2013 32.8* 36.0 - 46.0 % Final    Discharge Physical Exam:   General: alert, cooperative and no distress Lochia: appropriate Uterine Fundus: firm Incision: Honeycomb dressing CDI DVT Evaluation: No evidence of DVT seen on physical exam. Negative Homan's sign. No significant calf/ankle edema.  Intrapartum Procedures: cesarean: low cervical, transverse Postpartum Procedures: none Complications-Operative and Postpartum: none  Discharge Diagnoses: PTD via Primary  LTCS  Discharge Information:  Activity:           pelvic rest Diet:                routine Medications: Percocet and labetalol 200mg  Q6hrs Condition:      stable Instructions:  Care After Cesarean Delivery  Refer to this sheet in the next few weeks. These instructions provide you with information on caring for yourself after your procedure. Your caregiver may also give you specific instructions. Your treatment has been planned according to current medical practices, but problems sometimes occur. Call your caregiver if you have any problems or questions after you go home. HOME CARE INSTRUCTIONS  Only take over-the-counter or prescription medicines as directed by your caregiver.  Do not drink alcohol, especially if you are breastfeeding or taking medicine to relieve pain.  Do not chew or smoke tobacco.  Continue to use good perineal care. Good perineal care includes:  Wiping your perineum from front to back.  Keeping your perineum clean.  Check your cut (incision) daily for increased redness, drainage, swelling, or separation of skin.  Clean your incision gently with soap and water every day, and then pat it dry. If your caregiver says it is okay, leave the incision uncovered. Use a bandage (dressing) if the incision is draining fluid or appears irritated. If the adhesive strips across the incision do not fall off within 7 days, carefully peel them off.  Hug a  pillow when coughing or sneezing until your incision is healed. This helps to relieve pain.  Do not use tampons or douche until your caregiver says it is okay.  Shower, wash your hair, and take tub baths as directed by your caregiver.  Wear a well-fitting bra that provides breast support.  Limit wearing support panties or control-top hose.  Drink enough fluids to keep your urine clear or pale yellow.  Eat high-fiber foods such as whole grain cereals and breads, brown rice, beans, and fresh fruits and vegetables every  day. These foods may help prevent or relieve constipation.  Resume activities such as climbing stairs, driving, lifting, exercising, or traveling as directed by your caregiver.  Talk to your caregiver about resuming sexual activities. This is dependent upon your risk of infection, your rate of healing, and your comfort and desire to resume sexual activity.  Try to have someone help you with your household activities and your newborn for at least a few days after you leave the hospital.  Rest as much as possible. Try to rest or take a nap when your newborn is sleeping.  Increase your activities gradually.  Keep all of your scheduled postpartum appointments. It is very important to keep your scheduled follow-up appointments. At these appointments, your caregiver will be checking to make sure that you are healing physically and emotionally. SEEK MEDICAL CARE IF:   You are passing large clots from your vagina. Save any clots to show your caregiver.  You have a foul smelling discharge from your vagina.  You have trouble urinating.  You are urinating frequently.  You have pain when you urinate.  You have a change in your bowel movements.  You have increasing redness, pain, or swelling near your incision.  You have pus draining from your incision.  Your incision is separating.  You have painful, hard, or reddened breasts.  You have a severe headache.  You have blurred vision or see spots.  You feel sad or depressed.  You have thoughts of hurting yourself or your newborn.  You have questions about your care, the care of your newborn, or medicines.  You are dizzy or lightheaded.  You have a rash.  You have pain, redness, or swelling at the site of the removed intravenous access (IV) tube.  You have nausea or vomiting.  You stopped breastfeeding and have not had a menstrual period within 12 weeks of stopping.  You are not breastfeeding and have not had a menstrual period  within 12 weeks of delivery.  You have a fever. SEEK IMMEDIATE MEDICAL CARE IF:  You have persistent pain.  You have chest pain.  You have shortness of breath.  You faint.  You have leg pain.  You have stomach pain.  Your vaginal bleeding saturates 2 or more sanitary pads in 1 hour. MAKE SURE YOU:   Understand these instructions.  Will watch your condition.  Will get help right away if you are not doing well or get worse. Document Released: 04/27/2002 Document Revised: 04/29/2012 Document Reviewed: 04/01/2012 [facility] Patient Information 2014 [place], [place].   Postpartum Depression and Baby Blues  The postpartum period begins right after the birth of a baby. During this time, there is often a great amount of joy and excitement. It is also a time of considerable changes in the life of the parent(s). Regardless of how many times a mother gives birth, each child brings new challenges and dynamics to the family. It is not unusual to have  feelings of excitement accompanied by confusing shifts in moods, emotions, and thoughts. All mothers are at risk of developing postpartum depression or the "baby blues." These mood changes can occur right after giving birth, or they may occur many months after giving birth. The baby blues or postpartum depression can be mild or severe. Additionally, postpartum depression can resolve rather quickly, or it can be a long-term condition. CAUSES Elevated hormones and their rapid decline are thought to be a main cause of postpartum depression and the baby blues. There are a number of hormones that radically change during and after pregnancy. Estrogen and progesterone usually decrease immediately after delivering your baby. The level of thyroid hormone and various cortisol steroids also rapidly drop. Other factors that play a major role in these changes include major life events and genetics.  RISK FACTORS If you have any of the following risks for the baby  blues or postpartum depression, know what symptoms to watch out for during the postpartum period. Risk factors that may increase the likelihood of getting the baby blues or postpartum depression include:  Havinga personal or family history of depression.  Having depression while being pregnant.  Having premenstrual or oral contraceptive-associated mood issues.  Having exceptional life stress.  Having marital conflict.  Lacking a social support network.  Having a baby with special needs.  Having health problems such as diabetes. SYMPTOMS Baby blues symptoms include:  Brief fluctuations in mood, such as going from extreme happiness to sadness.  Decreased concentration.  Difficulty sleeping.  Crying spells, tearfulness.  Irritability.  Anxiety. Postpartum depression symptoms typically begin within the first month after giving birth. These symptoms include:  Difficulty sleeping or excessive sleepiness.  Marked weight loss.  Agitation.  Feelings of worthlessness.  Lack of interest in activity or food. Postpartum psychosis is a very concerning condition and can be dangerous. Fortunately, it is rare. Displaying any of the following symptoms is cause for immediate medical attention. Postpartum psychosis symptoms include:  Hallucinations and delusions.  Bizarre or disorganized behavior.  Confusion or disorientation. DIAGNOSIS  A diagnosis is made by an evaluation of your symptoms. There are no medical or lab tests that lead to a diagnosis, but there are various questionnaires that a caregiver may use to identify those with the baby blues, postpartum depression, or psychosis. Often times, a screening tool called the [place] Postnatal Depression Scale is used to diagnose depression in the postpartum period.  TREATMENT The baby blues usually goes away on its own in 1 to 2 weeks. Social support is often all that is needed. You should be encouraged to get adequate sleep and  rest. Occasionally, you may be given medicines to help you sleep.  Postpartum depression requires treatment as it can last several months or longer if it is not treated. Treatment may include individual or group therapy, medicine, or both to address any social, physiological, and psychological factors that may play a role in the depression. Regular exercise, a healthy diet, rest, and social support may also be strongly recommended.  Postpartum psychosis is more serious and needs treatment right away. Hospitalization is often needed. HOME CARE INSTRUCTIONS  Get as much rest as you can. Nap when the baby sleeps.  Exercise regularly. Some women find yoga and walking to be beneficial.  Eat a balanced and nourishing diet.  Do little things that you enjoy. Have a cup of tea, take a bubble bath, read your favorite magazine, or listen to your favorite music.  Avoid alcohol.  Ask for help with household chores, cooking, grocery shopping, or running errands as needed. Do not try to do everything.  Talk to people close to you about how you are feeling. Get support from your partner, family members, friends, or other new moms.  Try to stay positive in how you think. Think about the things you are grateful for.  Do not spend a lot of time alone.  Only take medicine as directed by your caregiver.  Keep all your postpartum appointments.  Let your caregiver know if you have any concerns. SEEK MEDICAL CARE IF: You are having a reaction or problems with your medicine. SEEK IMMEDIATE MEDICAL CARE IF:  You have suicidal feelings.  You feel you may harm the baby or someone else. Document Released: 05/09/2004 Document Revised: 10/28/2011 Document Reviewed: 06/11/2011 [facility] Patient Information 2014 [place].  Discharge to: home  Follow-up Information   [facility] weeks. ([facility] staff will contact you for appt for 2 wk B/P check. Schedule  your postpartum follow up appt for 5 weeks after discharge. Please call if you have any questions or concerns prior to your visits. )    Specialty:  Obstetrics and Gynecology   Contact information:   [address] [place] [id number] [phone]       Gerrit HeckMLY, Rexton Greulich [facility] Center[facility]NN 11/15/2013

## 2013-11-15 NOTE — Discharge Instructions (Signed)
Postpartum Care After Cesarean Delivery °After you deliver your newborn (postpartum period), the usual stay in the hospital is 24 72 hours. If there were problems with your labor or delivery, or if you have other medical problems, you might be in the hospital longer.  °While you are in the hospital, you will receive help and instructions on how to care for yourself and your newborn during the postpartum period.  °While you are in the hospital: °· It is normal for you to have pain or discomfort from the incision in your abdomen. Be sure to tell your nurses when you are having pain, where the pain is located, and what makes the pain worse. °· If you are breastfeeding, you may feel uncomfortable contractions of your uterus for a couple of weeks. This is normal. The contractions help your uterus get back to normal size. °· It is normal to have some bleeding after delivery. °· For the first 1 3 days after delivery, the flow is red and the amount may be similar to a period. °· It is common for the flow to start and stop. °· In the first few days, you may pass some small clots. Let your nurses know if you begin to pass large clots or your flow increases. °· Do not  flush blood clots down the toilet before having the nurse look at them. °· During the next 3 10 days after delivery, your flow should become more watery and pink or brown-tinged in color. °· Ten to fourteen days after delivery, your flow should be a small amount of yellowish-white discharge. °· The amount of your flow will decrease over the first few weeks after delivery. Your flow may stop in 6 8 weeks. Most women have had their flow stop by 12 weeks after delivery. °· You should change your sanitary pads frequently. °· Wash your hands thoroughly with soap and water for at least 20 seconds after changing pads, using the toilet, or before holding or feeding your newborn. °· Your intravenous (IV) tubing will be removed when you are drinking enough fluids. °· The  urine drainage tube (urinary catheter) that was inserted before delivery may be removed within 6 8 hours after delivery or when feeling returns to your legs. You should feel like you need to empty your bladder within the first 6 8 hours after the catheter has been removed. °· In case you become weak, lightheaded, or faint, call your nurse before you get out of bed for the first time and before you take a shower for the first time. °· Within the first few days after delivery, your breasts may begin to feel tender and full. This is called engorgement. Breast tenderness usually goes away within 48 72 hours after engorgement occurs. You may also notice milk leaking from your breasts. If you are not breastfeeding, do not stimulate your breasts. Breast stimulation can make your breasts produce more milk. °· Spending as much time as possible with your newborn is very important. During this time, you and your newborn can feel close and get to know each other. Having your newborn stay in your room (rooming in) will help to strengthen the bond with your newborn. It will give you time to get to know your newborn and become comfortable caring for your newborn. °· Your hormones change after delivery. Sometimes the hormone changes can temporarily cause you to feel sad or tearful. These feelings should not last more than a few days. If these feelings last longer   than that, you should talk to your caregiver.  If desired, talk to your caregiver about methods of family planning or contraception.  Talk to your caregiver about immunizations. Your caregiver may want you to have the following immunizations before leaving the hospital:  Tetanus, diphtheria, and pertussis (Tdap) or tetanus and diphtheria (Td) immunization. It is very important that you and your family (including grandparents) or others caring for your newborn are up-to-date with the Tdap or Td immunizations. The Tdap or Td immunization can help protect your newborn  from getting ill.  Rubella immunization.  Varicella (chickenpox) immunization.  Influenza immunization. You should receive this annual immunization if you did not receive the immunization during your pregnancy. Document Released: 04/29/2012 Document Reviewed: 04/29/2012 [facility] Patient Information 2014 [place], [place].  Postpartum Depression and Baby Blues The postpartum period begins right after the birth of a baby. During this time, there is often a great amount of joy and excitement. It is also a time of considerable changes in the life of the parent(s). Regardless of how many times a mother gives birth, each child brings new challenges and dynamics to the family. It is not unusual to have feelings of excitement accompanied by confusing shifts in moods, emotions, and thoughts. All mothers are at risk of developing postpartum depression or the "baby blues." These mood changes can occur right after giving birth, or they may occur many months after giving birth. The baby blues or postpartum depression can be mild or severe. Additionally, postpartum depression can resolve rather quickly, or it can be a long-term condition. CAUSES Elevated hormones and their rapid decline are thought to be a main cause of postpartum depression and the baby blues. There are a number of hormones that radically change during and after pregnancy. Estrogen and progesterone usually decrease immediately after delivering your baby. The level of thyroid hormone and various cortisol steroids also rapidly drop. Other factors that play a major role in these changes include major life events and genetics.  RISK FACTORS If you have any of the following risks for the baby blues or postpartum depression, know what symptoms to watch out for during the postpartum period. Risk factors that may increase the likelihood of getting the baby blues or postpartum depression include:  Havinga personal or family history of  depression.  Having depression while being pregnant.  Having premenstrual or oral contraceptive-associated mood issues.  Having exceptional life stress.  Having marital conflict.  Lacking a social support network.  Having a baby with special needs.  Having health problems such as diabetes. SYMPTOMS Baby blues symptoms include:  Brief fluctuations in mood, such as going from extreme happiness to sadness.  Decreased concentration.  Difficulty sleeping.  Crying spells, tearfulness.  Irritability.  Anxiety. Postpartum depression symptoms typically begin within the first month after giving birth. These symptoms include:  Difficulty sleeping or excessive sleepiness.  Marked weight loss.  Agitation.  Feelings of worthlessness.  Lack of interest in activity or food. Postpartum psychosis is a very concerning condition and can be dangerous. Fortunately, it is rare. Displaying any of the following symptoms is cause for immediate medical attention. Postpartum psychosis symptoms include:  Hallucinations and delusions.  Bizarre or disorganized behavior.  Confusion or disorientation. DIAGNOSIS  A diagnosis is made by an evaluation of your symptoms. There are no medical or lab tests that lead to a diagnosis, but there are various questionnaires that a caregiver may use to identify those with the baby blues, postpartum depression, or psychosis. Often times,  a screening tool called the [place] Postnatal Depression Scale is used to diagnose depression in the postpartum period.  TREATMENT The baby blues usually goes away on its own in 1 to 2 weeks. Social support is often all that is needed. You should be encouraged to get adequate sleep and rest. Occasionally, you may be given medicines to help you sleep.  Postpartum depression requires treatment as it can last several months or longer if it is not treated. Treatment may include individual or group therapy, medicine, or both to  address any social, physiological, and psychological factors that may play a role in the depression. Regular exercise, a healthy diet, rest, and social support may also be strongly recommended.  Postpartum psychosis is more serious and needs treatment right away. Hospitalization is often needed. HOME CARE INSTRUCTIONS  Get as much rest as you can. Nap when the baby sleeps.  Exercise regularly. Some women find yoga and walking to be beneficial.  Eat a balanced and nourishing diet.  Do little things that you enjoy. Have a cup of tea, take a bubble bath, read your favorite magazine, or listen to your favorite music.  Avoid alcohol.  Ask for help with household chores, cooking, grocery shopping, or running errands as needed. Do not try to do everything.  Talk to people close to you about how you are feeling. Get support from your partner, family members, friends, or other new moms.  Try to stay positive in how you think. Think about the things you are grateful for.  Do not spend a lot of time alone.  Only take medicine as directed by your caregiver.  Keep all your postpartum appointments.  Let your caregiver know if you have any concerns. SEEK MEDICAL CARE IF: You are having a reaction or problems with your medicine. SEEK IMMEDIATE MEDICAL CARE IF:  You have suicidal feelings.  You feel you may harm the baby or someone else. Document Released: 05/09/2004 Document Revised: 10/28/2011 Document Reviewed: 05/17/2013 [facility] Patient Information 2014 [place], [place].   Hypertension As your heart beats, it forces blood through your arteries. This force is your blood pressure. If the pressure is too high, it is called hypertension (HTN) or high blood pressure. HTN is dangerous because you may have it and not know it. High blood pressure may mean that your heart has to work harder to pump blood. Your arteries may be narrow or stiff. The extra work puts you at risk for heart disease,  stroke, and other problems.  Blood pressure consists of two numbers, a higher number over a lower, 110/72, for example. It is stated as "110 over 72." The ideal is below 120 for the top number (systolic) and under 80 for the bottom (diastolic). Write down your blood pressure today. You should pay close attention to your blood pressure if you have certain conditions such as:  Heart failure.  Prior heart attack.  Diabetes  Chronic kidney disease.  Prior stroke.  Multiple risk factors for heart disease. To see if you have HTN, your blood pressure should be measured while you are seated with your arm held at the level of the heart. It should be measured at least twice. A one-time elevated blood pressure reading (especially in the Emergency Department) does not mean that you need treatment. There may be conditions in which the blood pressure is different between your right and left arms. It is important to see your caregiver soon for a recheck. Most people have essential hypertension which means  that there is not a specific cause. This type of high blood pressure may be lowered by changing lifestyle factors such as:  Stress.  Smoking.  Lack of exercise.  Excessive weight.  Drug/tobacco/alcohol use.  Eating less salt. Most people do not have symptoms from high blood pressure until it has caused damage to the body. Effective treatment can often prevent, delay or reduce that damage. TREATMENT  When a cause has been identified, treatment for high blood pressure is directed at the cause. There are a large number of medications to treat HTN. These fall into several categories, and your caregiver will help you select the medicines that are best for you. Medications may have side effects. You should review side effects with your caregiver. If your blood pressure stays high after you have made lifestyle changes or started on medicines,   Your medication(s) may need to be changed.  Other  problems may need to be addressed.  Be certain you understand your prescriptions, and know how and when to take your medicine.  Be sure to follow up with your caregiver within the time frame advised (usually within two weeks) to have your blood pressure rechecked and to review your medications.  If you are taking more than one medicine to lower your blood pressure, make sure you know how and at what times they should be taken. Taking two medicines at the same time can result in blood pressure that is too low. SEEK IMMEDIATE MEDICAL CARE IF:  You develop a severe headache, blurred or changing vision, or confusion.  You have unusual weakness or numbness, or a faint feeling.  You have severe chest or abdominal pain, vomiting, or breathing problems. MAKE SURE YOU:   Understand these instructions.  Will watch your condition.  Will get help right away if you are not doing well or get worse. Document Released: 08/05/2005 Document Revised: 10/28/2011 Document Reviewed: 03/25/2008 [facility] Patient Information 2014 [place], [place].  Labetalol tablets What is this medicine? LABETALOL (la BET a lole) is a beta-blocker. Beta-blockers reduce the workload on the heart and help it to beat more regularly. This medicine is used to treat high blood pressure. This medicine may be used for other purposes; ask your health care provider or pharmacist if you have questions. COMMON BRAND NAME(S): Normodyne, Trandate What should I tell my health care provider before I take this medicine? They need to know if you have any of these conditions: -diabetes -history of heart attack, heart disease or heart failure -kidney disease -liver disease -lung or breathing disease, like asthma or emphysema -pheochromocytoma -thyroid disease -an unusual or allergic reaction to labetalol, other beta-blockers, medicines, foods, dyes, or preservatives -pregnant or trying to get pregnant -breast-feeding How should I use  this medicine? Take this medicine by mouth with a glass of water. Follow the directions on the prescription label. Take your doses at regular intervals. Do not take your medicine more often than directed. Do not stop taking this medicine suddenly. This could lead to serious heart-related effects. Talk to your pediatrician regarding the use of this medicine in children. Special care may be needed. Overdosage: If you think you have taken too much of this medicine contact a poison control center or emergency room at once. NOTE: This medicine is only for you. Do not share this medicine with others. What if I miss a dose? If you miss a dose, take it as soon as you can. If it is almost time for your next dose, take only  that dose. Do not take double or extra doses. What may interact with this medicine? This medicine also interact with the following medications: -certain medicines for blood pressure, heart disease, irregular heart beat -cimetidine -general anesthetics -medicines for asthma or lung disease like albuterol -medicines for depression -nitroglycerin This list may not describe all possible interactions. Give your health care provider a list of all the medicines, herbs, non-prescription drugs, or dietary supplements you use. Also tell them if you smoke, drink alcohol, or use illegal drugs. Some items may interact with your medicine. What should I watch for while using this medicine? Visit your doctor or health care professional for regular check ups. Check your blood pressure and pulse rate regularly. Ask your health care professional what your blood pressure and pulse rate should be, and when you should contact him or her. You may get drowsy or dizzy. Do not drive, use machinery, or do anything that needs mental alertness until you know how this medicine affects you. Do not stand or sit up quickly. Alcohol may interfere with the effect of this medicine. Avoid alcoholic drinks. This medicine can  affect blood sugar levels. If you have diabetes, check with your doctor or health care professional before you change your diet or the dose of your diabetic medicine. Do not treat yourself for coughs, colds, or pain while you are taking this medicine without asking your doctor or health care professional for advice. Some ingredients may increase your blood pressure. What side effects may I notice from receiving this medicine? Side effects that you should report to your doctor or health care professional as soon as possible: -allergic reactions like skin rash, itching or hives, swelling of the face, lips, or tongue -breathing problems -cold hands or feet -dark urine -depression -general ill feeling or flu-like symptoms -irregular heartbeat -light-colored stools -loss of appetite, nausea -pain or trouble passing urine -right upper belly pain -slow heart rate (fewer than recommended by your doctor or health care professional) -swollen legs or ankles -tingling of the scalp or skin -unusually weak or tired -vomiting -yellowing of the eyes or skin Side effects that usually do not require medical attention (report to your doctor or health care professional if they continue or are bothersome): -decreased sexual function or desire -dry itching skin -headache -tiredness This list may not describe all possible side effects. Call your doctor for medical advice about side effects. You may report side effects to FDA at 1-800-FDA-1088. Where should I keep my medicine? Keep out of the reach of children. Store at room temperature between 15 and 30 degrees C (59 and 86 degrees F). Protect from light. Keep container tightly closed. Throw away any unused medicine after the expiration date. NOTE: This sheet is a summary. It may not cover all possible information. If you have questions about this medicine, talk to your doctor, pharmacist, or health care provider.  2014, [employer]. (2013-04-09  14:34:23)

## 2013-11-15 NOTE — Lactation Note (Signed)
This note was copied from the chart of Emily Emily Turner. Lactation Consultation Note     Follow up consult with this mom of a NICU baby, now 67 hours postpartum. MOm was  Able to pump 2 ounces, and frequency and duration reviewed with mom. I rented mom a DEP, and will follow this mom in the nICU. Baby is still on a ventilator, but doing well. I will follow this family in the nICU  Patient Name: Emily Turner [id number]'s Date: 11/15/2013     Maternal Data    Feeding Feeding Type: Breast Milk Length of feed: 15 min  LATCH Score/Interventions                      Lactation Tools Discussed/Used     Consult Status      Emily Turner, Emily Turner Anne 11/15/2013, 4:52 PM

## 2013-11-15 NOTE — Progress Notes (Signed)
Post discharge review completed. 

## 2013-11-15 NOTE — Progress Notes (Signed)
Pt ambulated to nicu  Husband  Micah FlesherWent to car to take belongings

## 2013-12-31 ENCOUNTER — Inpatient Hospital Stay (HOSPITAL_COMMUNITY): Admission: AD | Admit: 2013-12-31 | Payer: Self-pay | Source: Ambulatory Visit | Admitting: Obstetrics & Gynecology

## 2014-06-20 ENCOUNTER — Encounter (HOSPITAL_COMMUNITY): Payer: Self-pay | Admitting: Obstetrics and Gynecology

## 2015-07-28 IMAGING — US US OB DETAIL+14 WK
1 series · 12 of 28 positions shown · non-contrast
Comparison: none

[Series 1: us ob detail +14 wk · 81 acquisitions, 12 frames shown]
[im 3/81]
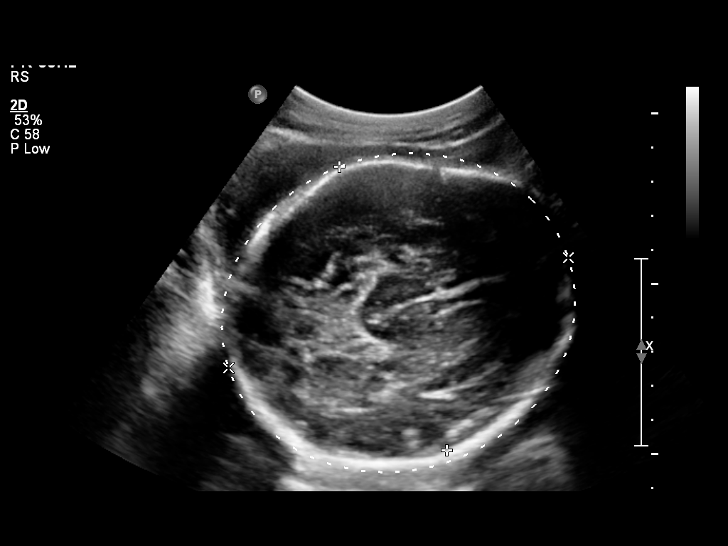
[im 9/81]
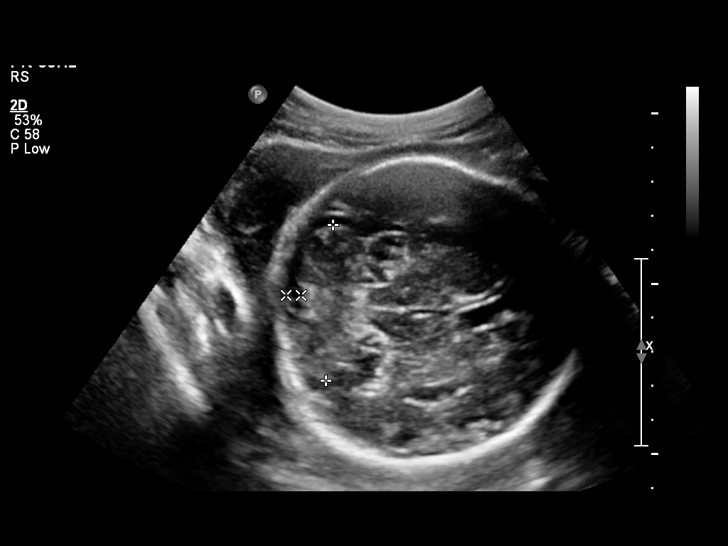
[im 15/81]
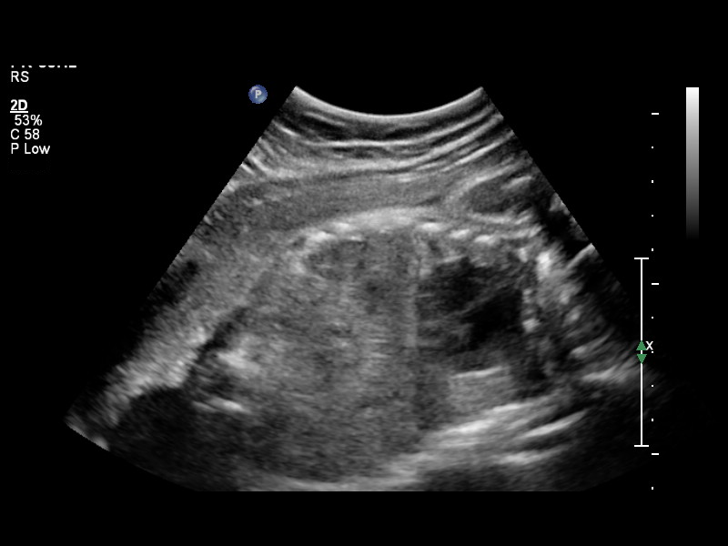
[im 24/81]
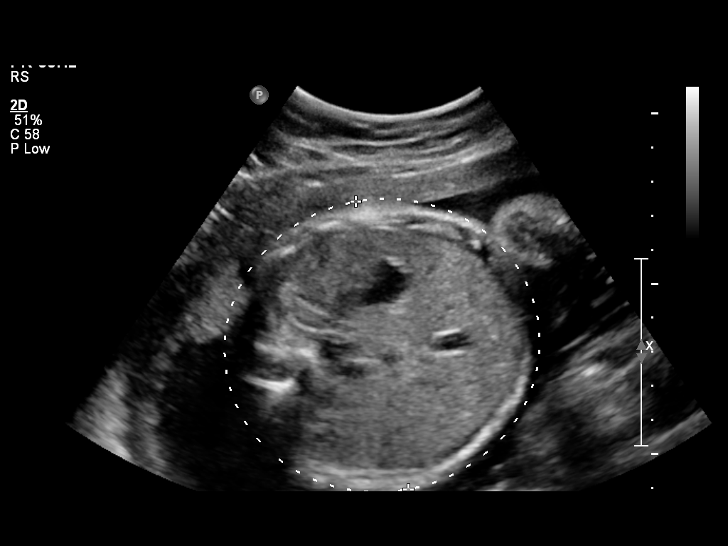
[im 30/81]
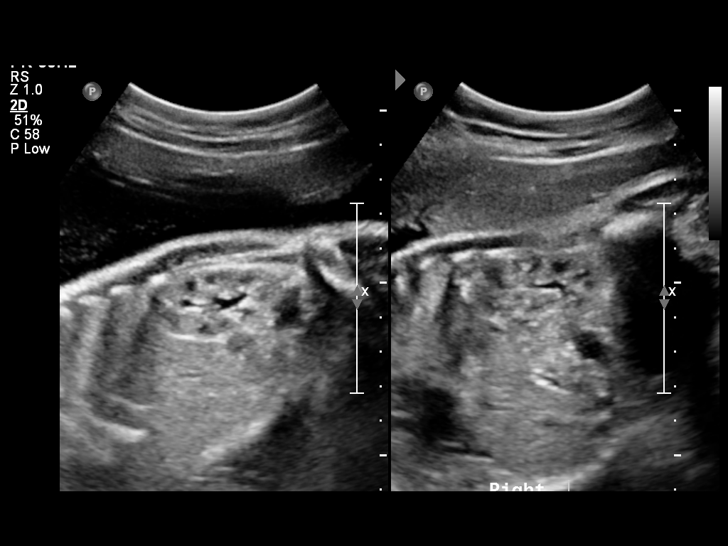
[im 36/81]
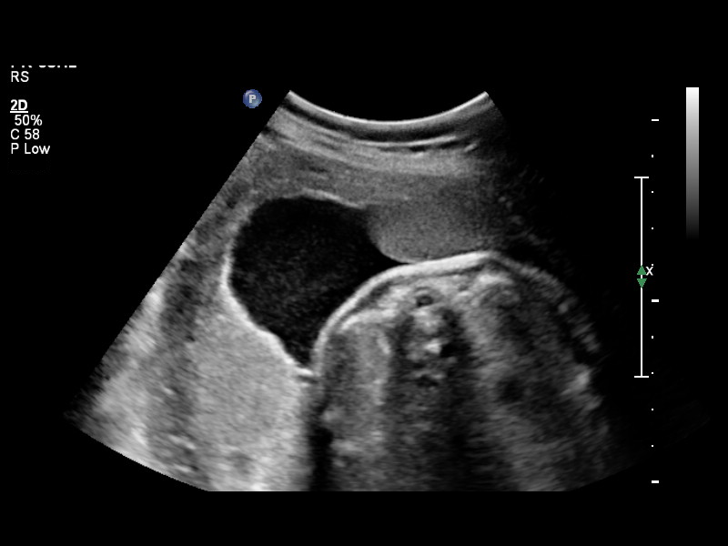
[im 45/81]
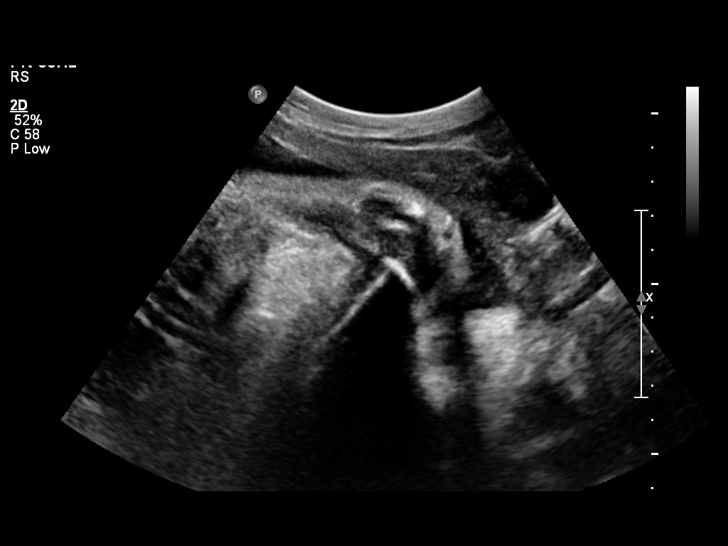
[im 51/81]
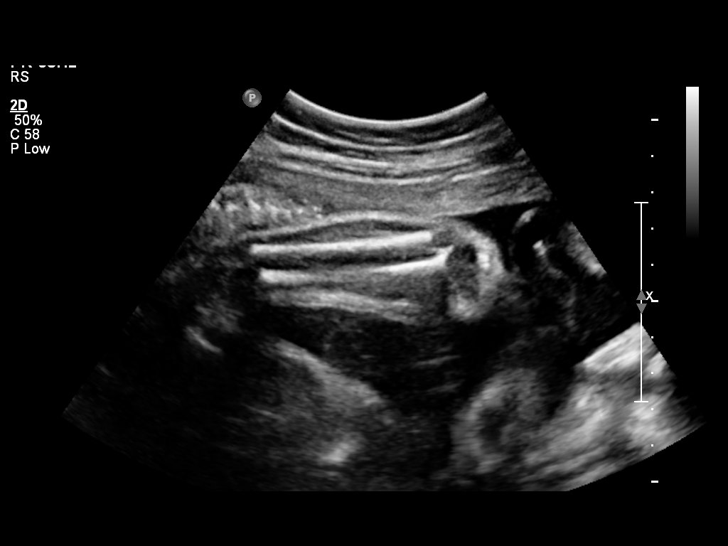
[im 57/81]
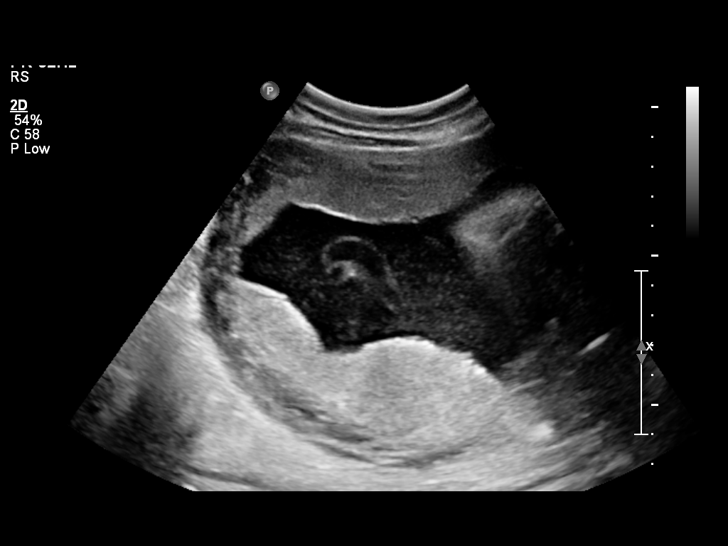
[im 66/81]
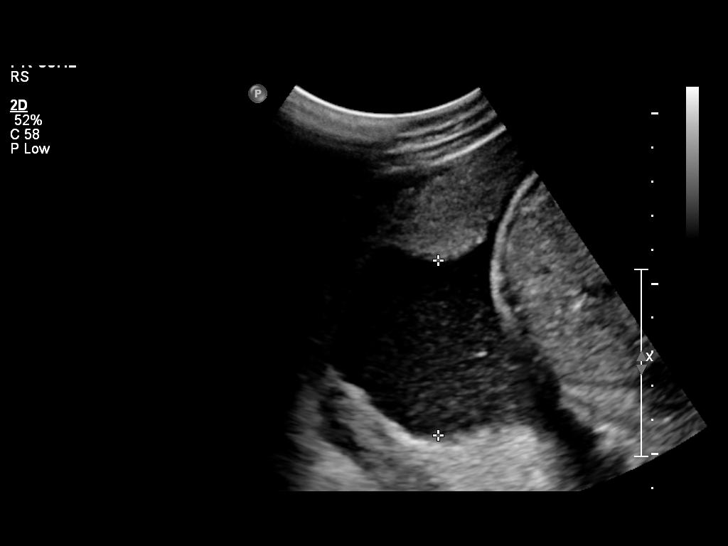
[im 72/81]
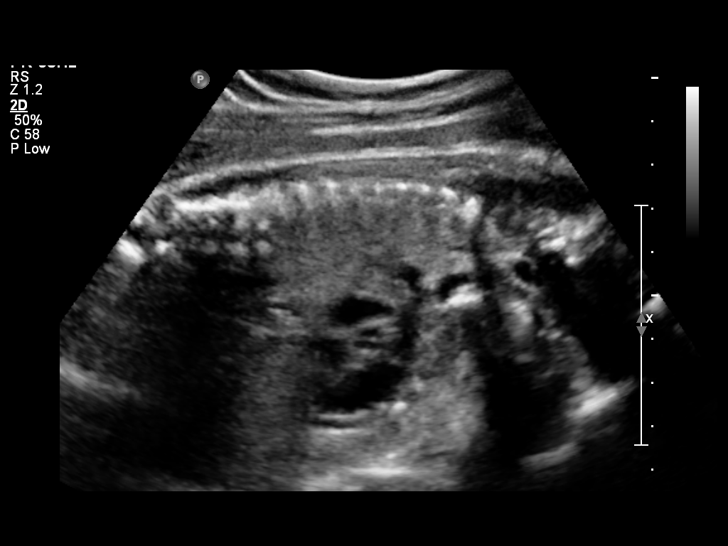
[im 78/81]
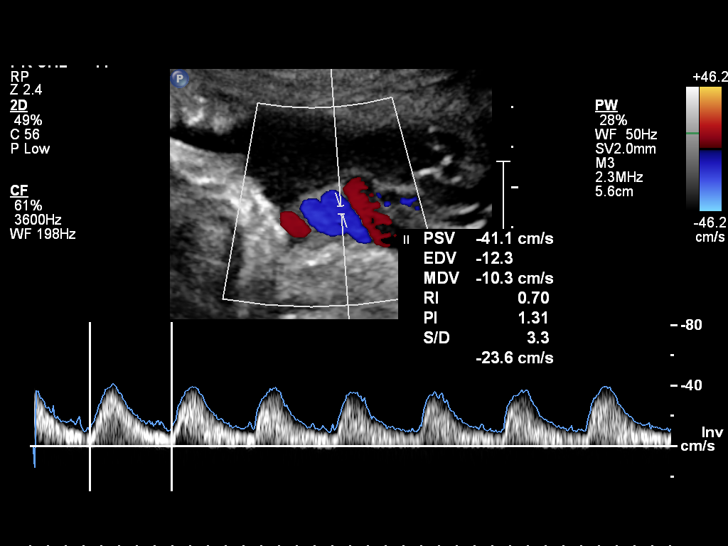

[12 of 28 positions shown; findings below may reference images not displayed]

OBSTETRICS REPORT
                      (Signed Final 11/07/2013 [DATE])

Service(s) Provided

 US OB DETAIL + 14 WK                                  76811.0
 US UA CORD DOPPLER                                    76820.0
Indications

 Hypertension - Gestational
Fetal Evaluation

 Num Of Fetuses:    1
 Fetal Heart Rate:  129                         bpm
 Cardiac Activity:  Observed
 Presentation:      Cephalic
 Placenta:          Fundal, above cervical os

 Amniotic Fluid
 AFI FV:      Subjectively within normal limits
 AFI Sum:     15.01   cm      53   %Tile     Larg Pckt:   5.98   cm
 RUQ:   5.11   cm    RLQ:    5.98   cm    LUQ:   1.6     cm   LLQ:    2.32   cm
Biometry

 BPD:     88.3  mm    G. Age:   35w 5d                CI:        82.19   70 - 86
                                                      FL/HC:      18.8   19.1 -

 HC:     307.3  mm    G. Age:   34w 2d       62  %    HC/AC:      1.10   0.96 -

 AC:     280.2  mm    G. Age:   32w 0d       39  %    FL/BPD:     65.6   71 - 87
 FL:      57.9  mm    G. Age:   30w 2d      < 3  %    FL/AC:      20.7   20 - 24
 HUM:     50.7  mm    G. Age:   29w 5d      < 5  %
 ULN:     48.7  mm    G. Age:   31w 0d       13  %
 TIB:     51.6  mm    G. Age:   30w 5d       22  %
 RAD:     42.2  mm    G. Age:   29w 1d       28  %
 FIB:     49.5  mm    G. Age:   30w 2d       45  %

 Est. FW:    6670  gm      4 lb 3 oz     49  %
Gestational Age

 Clinical EDD:  32w 3d                                        EDD:   12/29/13
 U/S Today:     33w 1d                                        EDD:   12/24/13
 Best:          32w 3d    Det. By:   Clinical EDD             EDD:   12/29/13
Anatomy
 Cranium:          Appears normal         Aortic Arch:      Appears normal
 Fetal Cavum:      Appears normal         Ductal Arch:      Appears normal
 Ventricles:       Appears normal         Diaphragm:        Appears normal
 Choroid Plexus:   Appears normal         Stomach:          Appears normal, left
                                                            sided
 Cerebellum:       Appears normal         Abdomen:          Appears normal
 Posterior Fossa:  Appears normal         Abdominal Wall:   Not well visualized
 Nuchal Fold:      Not applicable (>20    Cord Vessels:     Appears normal (3
                   wks GA)                                  vessel cord)
 Face:             Orbits appear          Kidneys:          Appear normal
                   normal
 Lips:             Appears normal         Bladder:          Appears normal
 Heart:            Appears normal         Spine:            Not well visualized
                   (4CH, axis, and
                   situs)
 RVOT:             Appears normal         Lower             Visualized
                                          Extremities:
 LVOT:             Appears normal         Upper             Visualized
                                          Extremities:

 Other:  Fetus appears to be a male. Technically difficult due to advanced GA
         and fetal position.
Doppler - Fetal Vessels

 Umbilical Artery
 S/D:   3.5            89  %tile
 Umbilical Artery
 Absent DFV:    No     Reverse DFV:    No

Cervix Uterus Adnexa

 Cervix:       Not visualized (advanced GA >84wks)
 Uterus:       No abnormality visualized.
 Left Ovary:   Not visualized.
 Right Ovary:  Not visualized.
Impression

 SIUP at 60w6d in gestation complicated by gestational
 hypertension with severe features by blood pressure criteria
 Cephalic presentation
 No dysmorphic features demonstrated with limitations as
 documented above
 EFW is at the 49th percentile
 UA Dopplers are normal (S/D between the mean and +1SD
 for gestational age)
 No previa
 AFI is normal for gestational age

Recommendations

 Continue inpatient management as per obstetrical provider.
 MFM will remain available for consultation (see full MFM
 consult for clinical management recommendations)
 Follow up ultrasound for AFI assessment in 1 week or sooner
 if clinically indicated.
 questions or concerns.

## 2015-12-14 DIAGNOSIS — Z6821 Body mass index (BMI) 21.0-21.9, adult: Secondary | ICD-10-CM | POA: Diagnosis not present

## 2015-12-14 DIAGNOSIS — I10 Essential (primary) hypertension: Secondary | ICD-10-CM | POA: Diagnosis not present

## 2015-12-14 DIAGNOSIS — Z308 Encounter for other contraceptive management: Secondary | ICD-10-CM | POA: Diagnosis not present

## 2015-12-14 DIAGNOSIS — G43909 Migraine, unspecified, not intractable, without status migrainosus: Secondary | ICD-10-CM | POA: Diagnosis not present

## 2016-09-02 DIAGNOSIS — I10 Essential (primary) hypertension: Secondary | ICD-10-CM | POA: Diagnosis not present

## 2016-09-02 DIAGNOSIS — G43909 Migraine, unspecified, not intractable, without status migrainosus: Secondary | ICD-10-CM | POA: Diagnosis not present

## 2016-09-02 DIAGNOSIS — J309 Allergic rhinitis, unspecified: Secondary | ICD-10-CM | POA: Diagnosis not present

## 2016-09-02 DIAGNOSIS — Z6823 Body mass index (BMI) 23.0-23.9, adult: Secondary | ICD-10-CM | POA: Diagnosis not present

## 2016-12-23 DIAGNOSIS — I10 Essential (primary) hypertension: Secondary | ICD-10-CM | POA: Diagnosis not present

## 2016-12-23 DIAGNOSIS — J309 Allergic rhinitis, unspecified: Secondary | ICD-10-CM | POA: Diagnosis not present

## 2016-12-23 DIAGNOSIS — Z733 Stress, not elsewhere classified: Secondary | ICD-10-CM | POA: Diagnosis not present

## 2016-12-31 ENCOUNTER — Other Ambulatory Visit: Payer: Self-pay | Admitting: Dermatology

## 2016-12-31 DIAGNOSIS — D1800 Hemangioma unspecified site: Secondary | ICD-10-CM | POA: Diagnosis not present

## 2016-12-31 DIAGNOSIS — D492 Neoplasm of unspecified behavior of bone, soft tissue, and skin: Secondary | ICD-10-CM | POA: Diagnosis not present

## 2016-12-31 DIAGNOSIS — D229 Melanocytic nevi, unspecified: Secondary | ICD-10-CM | POA: Diagnosis not present

## 2017-03-14 DIAGNOSIS — Z13220 Encounter for screening for lipoid disorders: Secondary | ICD-10-CM | POA: Diagnosis not present

## 2017-03-14 DIAGNOSIS — Z0000 Encounter for general adult medical examination without abnormal findings: Secondary | ICD-10-CM | POA: Diagnosis not present

## 2017-03-14 DIAGNOSIS — Z1329 Encounter for screening for other suspected endocrine disorder: Secondary | ICD-10-CM | POA: Diagnosis not present

## 2017-03-14 DIAGNOSIS — Z114 Encounter for screening for human immunodeficiency virus [HIV]: Secondary | ICD-10-CM | POA: Diagnosis not present

## 2017-03-18 DIAGNOSIS — Z01419 Encounter for gynecological examination (general) (routine) without abnormal findings: Secondary | ICD-10-CM | POA: Diagnosis not present

## 2017-03-18 DIAGNOSIS — Z0000 Encounter for general adult medical examination without abnormal findings: Secondary | ICD-10-CM | POA: Diagnosis not present

## 2017-03-18 DIAGNOSIS — N760 Acute vaginitis: Secondary | ICD-10-CM | POA: Diagnosis not present

## 2017-03-18 DIAGNOSIS — Z6823 Body mass index (BMI) 23.0-23.9, adult: Secondary | ICD-10-CM | POA: Diagnosis not present

## 2017-03-18 DIAGNOSIS — Z118 Encounter for screening for other infectious and parasitic diseases: Secondary | ICD-10-CM | POA: Diagnosis not present

## 2017-06-13 DIAGNOSIS — J069 Acute upper respiratory infection, unspecified: Secondary | ICD-10-CM | POA: Diagnosis not present

## 2017-06-13 DIAGNOSIS — R05 Cough: Secondary | ICD-10-CM | POA: Diagnosis not present

## 2017-06-13 DIAGNOSIS — Z6826 Body mass index (BMI) 26.0-26.9, adult: Secondary | ICD-10-CM | POA: Diagnosis not present

## 2017-08-26 DIAGNOSIS — I10 Essential (primary) hypertension: Secondary | ICD-10-CM | POA: Diagnosis not present

## 2017-08-26 DIAGNOSIS — Z6825 Body mass index (BMI) 25.0-25.9, adult: Secondary | ICD-10-CM | POA: Diagnosis not present

## 2017-08-26 DIAGNOSIS — J309 Allergic rhinitis, unspecified: Secondary | ICD-10-CM | POA: Diagnosis not present

## 2017-08-26 DIAGNOSIS — F411 Generalized anxiety disorder: Secondary | ICD-10-CM | POA: Diagnosis not present

## 2018-01-27 DIAGNOSIS — F329 Major depressive disorder, single episode, unspecified: Secondary | ICD-10-CM | POA: Diagnosis not present

## 2018-01-27 DIAGNOSIS — Z6824 Body mass index (BMI) 24.0-24.9, adult: Secondary | ICD-10-CM | POA: Diagnosis not present

## 2018-01-27 DIAGNOSIS — F411 Generalized anxiety disorder: Secondary | ICD-10-CM | POA: Diagnosis not present

## 2018-01-27 DIAGNOSIS — N920 Excessive and frequent menstruation with regular cycle: Secondary | ICD-10-CM | POA: Diagnosis not present

## 2018-01-28 DIAGNOSIS — N939 Abnormal uterine and vaginal bleeding, unspecified: Secondary | ICD-10-CM | POA: Diagnosis not present

## 2018-02-20 DIAGNOSIS — F4323 Adjustment disorder with mixed anxiety and depressed mood: Secondary | ICD-10-CM | POA: Diagnosis not present

## 2018-03-04 DIAGNOSIS — F4323 Adjustment disorder with mixed anxiety and depressed mood: Secondary | ICD-10-CM | POA: Diagnosis not present

## 2018-03-23 ENCOUNTER — Encounter: Payer: Self-pay | Admitting: Obstetrics and Gynecology

## 2018-03-23 DIAGNOSIS — Z13220 Encounter for screening for lipoid disorders: Secondary | ICD-10-CM | POA: Diagnosis not present

## 2018-03-23 DIAGNOSIS — Z0000 Encounter for general adult medical examination without abnormal findings: Secondary | ICD-10-CM | POA: Diagnosis not present

## 2018-03-23 DIAGNOSIS — Z1329 Encounter for screening for other suspected endocrine disorder: Secondary | ICD-10-CM | POA: Diagnosis not present

## 2018-03-23 DIAGNOSIS — Z6823 Body mass index (BMI) 23.0-23.9, adult: Secondary | ICD-10-CM | POA: Diagnosis not present

## 2018-03-23 DIAGNOSIS — Z114 Encounter for screening for human immunodeficiency virus [HIV]: Secondary | ICD-10-CM | POA: Diagnosis not present

## 2018-03-23 DIAGNOSIS — Z01419 Encounter for gynecological examination (general) (routine) without abnormal findings: Secondary | ICD-10-CM | POA: Diagnosis not present

## 2018-03-23 DIAGNOSIS — N939 Abnormal uterine and vaginal bleeding, unspecified: Secondary | ICD-10-CM | POA: Diagnosis not present

## 2018-03-27 DIAGNOSIS — Z0000 Encounter for general adult medical examination without abnormal findings: Secondary | ICD-10-CM | POA: Diagnosis not present

## 2018-03-27 DIAGNOSIS — F4323 Adjustment disorder with mixed anxiety and depressed mood: Secondary | ICD-10-CM | POA: Diagnosis not present

## 2018-03-27 DIAGNOSIS — Z6823 Body mass index (BMI) 23.0-23.9, adult: Secondary | ICD-10-CM | POA: Diagnosis not present

## 2018-05-01 DIAGNOSIS — Z23 Encounter for immunization: Secondary | ICD-10-CM | POA: Diagnosis not present

## 2018-05-04 DIAGNOSIS — N939 Abnormal uterine and vaginal bleeding, unspecified: Secondary | ICD-10-CM | POA: Diagnosis not present

## 2018-05-04 DIAGNOSIS — N920 Excessive and frequent menstruation with regular cycle: Secondary | ICD-10-CM | POA: Diagnosis not present

## 2018-05-05 DIAGNOSIS — F4323 Adjustment disorder with mixed anxiety and depressed mood: Secondary | ICD-10-CM | POA: Diagnosis not present

## 2018-05-28 DIAGNOSIS — Z733 Stress, not elsewhere classified: Secondary | ICD-10-CM | POA: Diagnosis not present

## 2018-05-28 DIAGNOSIS — K219 Gastro-esophageal reflux disease without esophagitis: Secondary | ICD-10-CM | POA: Diagnosis not present

## 2018-05-28 DIAGNOSIS — G4709 Other insomnia: Secondary | ICD-10-CM | POA: Diagnosis not present

## 2018-07-13 DIAGNOSIS — F4323 Adjustment disorder with mixed anxiety and depressed mood: Secondary | ICD-10-CM | POA: Diagnosis not present

## 2018-08-20 DIAGNOSIS — F4323 Adjustment disorder with mixed anxiety and depressed mood: Secondary | ICD-10-CM | POA: Diagnosis not present

## 2018-09-23 DIAGNOSIS — F4323 Adjustment disorder with mixed anxiety and depressed mood: Secondary | ICD-10-CM | POA: Diagnosis not present

## 2018-09-25 DIAGNOSIS — I10 Essential (primary) hypertension: Secondary | ICD-10-CM | POA: Diagnosis not present

## 2018-09-25 DIAGNOSIS — J069 Acute upper respiratory infection, unspecified: Secondary | ICD-10-CM | POA: Diagnosis not present

## 2018-09-25 DIAGNOSIS — D72829 Elevated white blood cell count, unspecified: Secondary | ICD-10-CM | POA: Diagnosis not present

## 2018-09-25 DIAGNOSIS — J309 Allergic rhinitis, unspecified: Secondary | ICD-10-CM | POA: Diagnosis not present

## 2018-12-15 DIAGNOSIS — N390 Urinary tract infection, site not specified: Secondary | ICD-10-CM | POA: Diagnosis not present

## 2019-01-26 DIAGNOSIS — F4323 Adjustment disorder with mixed anxiety and depressed mood: Secondary | ICD-10-CM | POA: Diagnosis not present

## 2019-03-22 DIAGNOSIS — F4323 Adjustment disorder with mixed anxiety and depressed mood: Secondary | ICD-10-CM | POA: Diagnosis not present

## 2019-04-12 DIAGNOSIS — Z3040 Encounter for surveillance of contraceptives, unspecified: Secondary | ICD-10-CM | POA: Diagnosis not present

## 2019-04-12 DIAGNOSIS — Z6821 Body mass index (BMI) 21.0-21.9, adult: Secondary | ICD-10-CM | POA: Diagnosis not present

## 2019-04-12 DIAGNOSIS — Z1239 Encounter for other screening for malignant neoplasm of breast: Secondary | ICD-10-CM | POA: Diagnosis not present

## 2019-04-12 DIAGNOSIS — Z01419 Encounter for gynecological examination (general) (routine) without abnormal findings: Secondary | ICD-10-CM | POA: Diagnosis not present

## 2019-04-13 DIAGNOSIS — F4323 Adjustment disorder with mixed anxiety and depressed mood: Secondary | ICD-10-CM | POA: Diagnosis not present

## 2019-04-30 DIAGNOSIS — F4323 Adjustment disorder with mixed anxiety and depressed mood: Secondary | ICD-10-CM | POA: Diagnosis not present

## 2019-05-06 DIAGNOSIS — Z23 Encounter for immunization: Secondary | ICD-10-CM | POA: Diagnosis not present

## 2019-05-25 DIAGNOSIS — F4323 Adjustment disorder with mixed anxiety and depressed mood: Secondary | ICD-10-CM | POA: Diagnosis not present

## 2019-07-01 DIAGNOSIS — F4323 Adjustment disorder with mixed anxiety and depressed mood: Secondary | ICD-10-CM | POA: Diagnosis not present

## 2019-07-13 DIAGNOSIS — F4323 Adjustment disorder with mixed anxiety and depressed mood: Secondary | ICD-10-CM | POA: Diagnosis not present

## 2019-07-27 DIAGNOSIS — F4323 Adjustment disorder with mixed anxiety and depressed mood: Secondary | ICD-10-CM | POA: Diagnosis not present

## 2019-08-03 DIAGNOSIS — G4709 Other insomnia: Secondary | ICD-10-CM | POA: Diagnosis not present

## 2019-08-03 DIAGNOSIS — K219 Gastro-esophageal reflux disease without esophagitis: Secondary | ICD-10-CM | POA: Diagnosis not present

## 2019-08-03 DIAGNOSIS — F329 Major depressive disorder, single episode, unspecified: Secondary | ICD-10-CM | POA: Diagnosis not present

## 2019-08-03 DIAGNOSIS — F411 Generalized anxiety disorder: Secondary | ICD-10-CM | POA: Diagnosis not present

## 2019-08-06 DIAGNOSIS — K219 Gastro-esophageal reflux disease without esophagitis: Secondary | ICD-10-CM | POA: Diagnosis not present

## 2019-08-06 DIAGNOSIS — F411 Generalized anxiety disorder: Secondary | ICD-10-CM | POA: Diagnosis not present

## 2019-08-06 DIAGNOSIS — Z733 Stress, not elsewhere classified: Secondary | ICD-10-CM | POA: Diagnosis not present

## 2019-08-07 DIAGNOSIS — F4323 Adjustment disorder with mixed anxiety and depressed mood: Secondary | ICD-10-CM | POA: Diagnosis not present

## 2019-09-02 DIAGNOSIS — F4323 Adjustment disorder with mixed anxiety and depressed mood: Secondary | ICD-10-CM | POA: Diagnosis not present

## 2019-09-15 DIAGNOSIS — K219 Gastro-esophageal reflux disease without esophagitis: Secondary | ICD-10-CM | POA: Diagnosis not present

## 2019-09-15 DIAGNOSIS — F329 Major depressive disorder, single episode, unspecified: Secondary | ICD-10-CM | POA: Diagnosis not present

## 2019-09-15 DIAGNOSIS — F411 Generalized anxiety disorder: Secondary | ICD-10-CM | POA: Diagnosis not present

## 2019-09-15 DIAGNOSIS — I10 Essential (primary) hypertension: Secondary | ICD-10-CM | POA: Diagnosis not present

## 2019-10-13 DIAGNOSIS — F4323 Adjustment disorder with mixed anxiety and depressed mood: Secondary | ICD-10-CM | POA: Diagnosis not present

## 2019-10-27 DIAGNOSIS — F411 Generalized anxiety disorder: Secondary | ICD-10-CM | POA: Diagnosis not present

## 2019-10-27 DIAGNOSIS — I10 Essential (primary) hypertension: Secondary | ICD-10-CM | POA: Diagnosis not present

## 2019-10-27 DIAGNOSIS — K219 Gastro-esophageal reflux disease without esophagitis: Secondary | ICD-10-CM | POA: Diagnosis not present

## 2019-11-11 DIAGNOSIS — F4323 Adjustment disorder with mixed anxiety and depressed mood: Secondary | ICD-10-CM | POA: Diagnosis not present

## 2019-12-01 DIAGNOSIS — F329 Major depressive disorder, single episode, unspecified: Secondary | ICD-10-CM | POA: Diagnosis not present

## 2019-12-01 DIAGNOSIS — F411 Generalized anxiety disorder: Secondary | ICD-10-CM | POA: Diagnosis not present

## 2019-12-01 DIAGNOSIS — G4709 Other insomnia: Secondary | ICD-10-CM | POA: Diagnosis not present

## 2020-01-12 DIAGNOSIS — F411 Generalized anxiety disorder: Secondary | ICD-10-CM | POA: Diagnosis not present

## 2020-01-12 DIAGNOSIS — K219 Gastro-esophageal reflux disease without esophagitis: Secondary | ICD-10-CM | POA: Diagnosis not present

## 2020-01-12 DIAGNOSIS — F329 Major depressive disorder, single episode, unspecified: Secondary | ICD-10-CM | POA: Diagnosis not present

## 2020-02-25 DIAGNOSIS — Z1159 Encounter for screening for other viral diseases: Secondary | ICD-10-CM | POA: Diagnosis not present

## 2020-02-25 DIAGNOSIS — Z0000 Encounter for general adult medical examination without abnormal findings: Secondary | ICD-10-CM | POA: Diagnosis not present

## 2020-02-25 DIAGNOSIS — Z13220 Encounter for screening for lipoid disorders: Secondary | ICD-10-CM | POA: Diagnosis not present

## 2020-03-01 DIAGNOSIS — Z0000 Encounter for general adult medical examination without abnormal findings: Secondary | ICD-10-CM | POA: Diagnosis not present

## 2020-03-01 DIAGNOSIS — Z23 Encounter for immunization: Secondary | ICD-10-CM | POA: Diagnosis not present

## 2020-03-14 DIAGNOSIS — F4323 Adjustment disorder with mixed anxiety and depressed mood: Secondary | ICD-10-CM | POA: Diagnosis not present

## 2020-05-03 DIAGNOSIS — F4323 Adjustment disorder with mixed anxiety and depressed mood: Secondary | ICD-10-CM | POA: Diagnosis not present

## 2020-05-09 DIAGNOSIS — Z01419 Encounter for gynecological examination (general) (routine) without abnormal findings: Secondary | ICD-10-CM | POA: Diagnosis not present

## 2020-05-09 DIAGNOSIS — Z124 Encounter for screening for malignant neoplasm of cervix: Secondary | ICD-10-CM | POA: Diagnosis not present

## 2020-05-09 DIAGNOSIS — Z6821 Body mass index (BMI) 21.0-21.9, adult: Secondary | ICD-10-CM | POA: Diagnosis not present

## 2020-05-09 DIAGNOSIS — Z3040 Encounter for surveillance of contraceptives, unspecified: Secondary | ICD-10-CM | POA: Diagnosis not present

## 2020-05-31 DIAGNOSIS — F4323 Adjustment disorder with mixed anxiety and depressed mood: Secondary | ICD-10-CM | POA: Diagnosis not present

## 2020-07-04 DIAGNOSIS — F4323 Adjustment disorder with mixed anxiety and depressed mood: Secondary | ICD-10-CM | POA: Diagnosis not present

## 2020-07-12 DIAGNOSIS — K219 Gastro-esophageal reflux disease without esophagitis: Secondary | ICD-10-CM | POA: Diagnosis not present

## 2020-07-12 DIAGNOSIS — G4700 Insomnia, unspecified: Secondary | ICD-10-CM | POA: Diagnosis not present

## 2020-07-12 DIAGNOSIS — F331 Major depressive disorder, recurrent, moderate: Secondary | ICD-10-CM | POA: Diagnosis not present

## 2020-07-12 DIAGNOSIS — F411 Generalized anxiety disorder: Secondary | ICD-10-CM | POA: Diagnosis not present

## 2020-07-25 DIAGNOSIS — F4323 Adjustment disorder with mixed anxiety and depressed mood: Secondary | ICD-10-CM | POA: Diagnosis not present

## 2023-05-08 ENCOUNTER — Other Ambulatory Visit: Payer: Self-pay | Admitting: Obstetrics and Gynecology

## 2023-05-08 DIAGNOSIS — R928 Other abnormal and inconclusive findings on diagnostic imaging of breast: Secondary | ICD-10-CM

## 2023-06-03 ENCOUNTER — Ambulatory Visit: Payer: Self-pay

## 2023-06-10 ENCOUNTER — Ambulatory Visit
Admission: RE | Admit: 2023-06-10 | Discharge: 2023-06-10 | Disposition: A | Discharge status: Home / Self Care | Payer: Self-pay | Source: Ambulatory Visit | Attending: Obstetrics and Gynecology | Admitting: Obstetrics and Gynecology

## 2023-06-10 ENCOUNTER — Ambulatory Visit
Admission: RE | Admit: 2023-06-10 | Discharge: 2023-06-10 | Disposition: A | Discharge status: Home / Self Care | Payer: Commercial Managed Care - HMO | Source: Ambulatory Visit | Attending: Obstetrics and Gynecology | Admitting: Obstetrics and Gynecology

## 2023-06-10 DIAGNOSIS — R928 Other abnormal and inconclusive findings on diagnostic imaging of breast: Secondary | ICD-10-CM

## 2024-06-24 ENCOUNTER — Other Ambulatory Visit: Payer: Self-pay | Admitting: Obstetrics and Gynecology

## 2024-06-24 DIAGNOSIS — Z1231 Encounter for screening mammogram for malignant neoplasm of breast: Secondary | ICD-10-CM

## 2024-08-31 ENCOUNTER — Ambulatory Visit
